# Patient Record
Sex: Female | Born: 1985
Health system: Southern US, Community
[De-identification: ages and names within clinical notes are randomized; demographics above are authoritative.]

## PROBLEM LIST (undated history)

## (undated) DIAGNOSIS — E119 Type 2 diabetes mellitus without complications: Secondary | ICD-10-CM

## (undated) DIAGNOSIS — M1990 Unspecified osteoarthritis, unspecified site: Secondary | ICD-10-CM

## (undated) DIAGNOSIS — D649 Anemia, unspecified: Secondary | ICD-10-CM

## (undated) DIAGNOSIS — E669 Obesity, unspecified: Secondary | ICD-10-CM

## (undated) DIAGNOSIS — I10 Essential (primary) hypertension: Secondary | ICD-10-CM

## (undated) HISTORY — DX: Essential (primary) hypertension: I10

## (undated) HISTORY — DX: Anemia, unspecified: D64.9

## (undated) HISTORY — DX: Unspecified osteoarthritis, unspecified site: M19.90

## (undated) HISTORY — DX: Obesity, unspecified: E66.9

---
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
## 2007-05-29 HISTORY — PX: DILATION AND CURETTAGE OF UTERUS: SHX78

## 2007-08-24 ENCOUNTER — Emergency Department: Payer: Self-pay | Admitting: Emergency Medicine

## 2007-09-01 ENCOUNTER — Emergency Department: Payer: Self-pay | Admitting: Emergency Medicine

## 2008-01-12 ENCOUNTER — Emergency Department: Payer: Self-pay | Admitting: Emergency Medicine

## 2008-03-02 ENCOUNTER — Emergency Department: Payer: Self-pay | Admitting: Emergency Medicine

## 2008-04-02 ENCOUNTER — Emergency Department: Payer: Self-pay | Admitting: Emergency Medicine

## 2008-04-08 ENCOUNTER — Ambulatory Visit: Payer: Self-pay | Admitting: Obstetrics and Gynecology

## 2008-04-09 ENCOUNTER — Ambulatory Visit: Payer: Self-pay | Admitting: Obstetrics and Gynecology

## 2008-10-06 ENCOUNTER — Emergency Department: Payer: Self-pay | Admitting: Emergency Medicine

## 2008-10-31 ENCOUNTER — Emergency Department: Payer: Self-pay | Admitting: Emergency Medicine

## 2008-12-18 ENCOUNTER — Emergency Department: Payer: Self-pay | Admitting: Emergency Medicine

## 2009-01-05 ENCOUNTER — Emergency Department: Payer: Self-pay | Admitting: Emergency Medicine

## 2009-02-19 ENCOUNTER — Observation Stay: Payer: Self-pay | Admitting: Unknown Physician Specialty

## 2009-03-14 ENCOUNTER — Ambulatory Visit: Payer: Self-pay | Admitting: Obstetrics and Gynecology

## 2009-03-28 ENCOUNTER — Ambulatory Visit: Payer: Self-pay | Admitting: Obstetrics and Gynecology

## 2009-03-28 ENCOUNTER — Encounter: Payer: Self-pay | Admitting: Maternal & Fetal Medicine

## 2009-04-04 ENCOUNTER — Encounter: Payer: Self-pay | Admitting: Obstetrics and Gynecology

## 2009-04-14 ENCOUNTER — Observation Stay: Payer: Self-pay

## 2009-04-14 ENCOUNTER — Encounter: Payer: Self-pay | Admitting: Obstetrics and Gynecology

## 2009-04-25 ENCOUNTER — Encounter: Payer: Self-pay | Admitting: Maternal & Fetal Medicine

## 2009-04-27 ENCOUNTER — Ambulatory Visit: Payer: Self-pay | Admitting: Obstetrics and Gynecology

## 2009-04-28 ENCOUNTER — Observation Stay: Payer: Self-pay

## 2009-05-04 ENCOUNTER — Observation Stay: Payer: Self-pay

## 2009-05-09 ENCOUNTER — Encounter: Payer: Self-pay | Admitting: Obstetrics and Gynecology

## 2009-05-14 ENCOUNTER — Observation Stay: Payer: Self-pay

## 2009-05-23 ENCOUNTER — Encounter: Payer: Self-pay | Admitting: Obstetrics and Gynecology

## 2009-05-27 ENCOUNTER — Observation Stay: Payer: Self-pay | Admitting: Obstetrics & Gynecology

## 2009-05-29 ENCOUNTER — Inpatient Hospital Stay: Payer: Self-pay | Admitting: Obstetrics & Gynecology

## 2009-05-31 DIAGNOSIS — O24419 Gestational diabetes mellitus in pregnancy, unspecified control: Secondary | ICD-10-CM

## 2009-06-24 ENCOUNTER — Emergency Department: Payer: Self-pay | Admitting: Emergency Medicine

## 2010-02-18 ENCOUNTER — Emergency Department: Payer: Self-pay | Admitting: Emergency Medicine

## 2010-09-22 ENCOUNTER — Emergency Department: Payer: Self-pay | Admitting: Emergency Medicine

## 2010-10-18 ENCOUNTER — Emergency Department: Payer: Self-pay | Admitting: Unknown Physician Specialty

## 2010-12-21 ENCOUNTER — Emergency Department: Payer: Self-pay | Admitting: Unknown Physician Specialty

## 2011-02-12 ENCOUNTER — Emergency Department: Payer: Self-pay | Admitting: Emergency Medicine

## 2011-04-13 ENCOUNTER — Emergency Department: Payer: Self-pay | Admitting: *Deleted

## 2011-04-15 ENCOUNTER — Other Ambulatory Visit: Payer: Self-pay | Admitting: *Deleted

## 2011-04-23 ENCOUNTER — Emergency Department: Payer: Self-pay | Admitting: Unknown Physician Specialty

## 2011-08-31 LAB — COMPREHENSIVE METABOLIC PANEL
Albumin: 2.9 g/dL — ABNORMAL LOW (ref 3.4–5.0)
Alkaline Phosphatase: 60 U/L (ref 50–136)
Bilirubin,Total: 0.4 mg/dL (ref 0.2–1.0)
Calcium, Total: 8.8 mg/dL (ref 8.5–10.1)
Chloride: 108 mmol/L — ABNORMAL HIGH (ref 98–107)
Creatinine: 0.59 mg/dL — ABNORMAL LOW (ref 0.60–1.30)
EGFR (Non-African Amer.): >60
SGOT(AST): 16 U/L (ref 15–37)
Sodium: 144 mmol/L (ref 136–145)

## 2011-08-31 LAB — CBC
MCH: 22.9 pg — ABNORMAL LOW (ref 26.0–34.0)
MCHC: 31.8 g/dL — ABNORMAL LOW (ref 32.0–36.0)
Platelet: 269 10*3/uL (ref 150–440)
RDW: 16.7 % — ABNORMAL HIGH (ref 11.5–14.5)

## 2011-08-31 LAB — PROTIME-INR
INR: 1
Prothrombin Time: 14 secs (ref 11.5–14.7)

## 2011-09-01 ENCOUNTER — Observation Stay: Payer: Self-pay

## 2011-12-04 ENCOUNTER — Ambulatory Visit: Payer: Self-pay

## 2011-12-04 LAB — CBC WITH DIFFERENTIAL/PLATELET
Basophil %: 0.8 %
Eosinophil %: 0.4 %
HGB: 7 g/dL — ABNORMAL LOW (ref 12.0–16.0)
Lymphocyte %: 19 %
MCV: 65 fL — ABNORMAL LOW (ref 80–100)
Monocyte #: 0.4 x10 3/mm (ref 0.2–0.9)
Monocyte %: 4.7 %
Neutrophil %: 75.1 %
Platelet: 152 10*3/uL (ref 150–440)
RBC: 3.6 10*6/uL — ABNORMAL LOW (ref 3.80–5.20)
WBC: 8.2 10*3/uL (ref 3.6–11.0)

## 2011-12-05 ENCOUNTER — Inpatient Hospital Stay: Payer: Self-pay | Admitting: Obstetrics and Gynecology

## 2011-12-06 LAB — HEMATOCRIT: HCT: 20.9 % — ABNORMAL LOW (ref 35.0–47.0)

## 2012-06-22 ENCOUNTER — Emergency Department (HOSPITAL_COMMUNITY)
Admission: EM | Admit: 2012-06-22 | Discharge: 2012-06-22 | Discharge status: Against Medical Advice | Payer: Self-pay | Attending: Emergency Medicine | Admitting: Emergency Medicine

## 2012-06-22 ENCOUNTER — Encounter (HOSPITAL_COMMUNITY): Payer: Self-pay | Admitting: Cardiology

## 2012-06-22 DIAGNOSIS — R222 Localized swelling, mass and lump, trunk: Secondary | ICD-10-CM | POA: Insufficient documentation

## 2012-06-22 NOTE — ED Notes (Signed)
Pt reports a lump on the left side of her chest that developed about 4 days ago. Reports it's on the inner side of her breast. States area is painful.

## 2012-10-09 ENCOUNTER — Ambulatory Visit: Payer: Self-pay | Admitting: Unknown Physician Specialty

## 2013-06-11 ENCOUNTER — Emergency Department: Payer: Self-pay | Admitting: Emergency Medicine

## 2013-06-14 LAB — BETA STREP CULTURE(ARMC)

## 2014-01-26 ENCOUNTER — Emergency Department: Payer: Self-pay | Admitting: Emergency Medicine

## 2014-02-06 ENCOUNTER — Emergency Department: Payer: Self-pay | Admitting: Emergency Medicine

## 2014-02-06 LAB — URIC ACID: Uric Acid: 4.9 mg/dL (ref 2.6–6.0)

## 2014-09-19 NOTE — Op Note (Signed)
PATIENT NAME:  Lisa Richard, Lisa Richard MR#:  [id number] DATE OF BIRTH:  1985/10/23  DATE OF PROCEDURE:  12/05/2011  PREOPERATIVE DIAGNOSES:  1. Intrauterine pregnancy, term.  2. Previous cesarean section, desires repeat.   POSTOPERATIVE DIAGNOSES:  1. Intrauterine pregnancy, term.  2. Previous cesarean section, desires repeat.   PROCEDURE PERFORMED: Low transverse cesarean section.   SURGEON: Deloris Pinghilip J. Luella Cookosenow, M.D.   FIRST ASSISTANT: Morrie SheldonAshley  <<MISSING TEXT>> (dictation stopped) ____________________________ Deloris PingPhilip J. Luella Cookosenow, MD pjr:slb D: 12/05/2011 08:59:17 ET T: 12/05/2011 09:48:24 ET JOB#: [id number]  cc: Deloris PingPhilip J. Luella Cookosenow, MD, <Dictator>

## 2014-09-19 NOTE — Op Note (Signed)
PATIENT NAME:  Lisa KnudsenCHAVIS, Myleka C MR#:  [id number] DATE OF BIRTH:  05-22-1986  DATE OF PROCEDURE:  12/05/2011  PREOPERATIVE DIAGNOSIS:  Intrauterine pregnancy, term.  Previous cesarean section, desires repeat.   POSTOPERATIVE DIAGNOSIS:  Intrauterine pregnancy, term.  Previous cesarean section, desires repeat.   OPERATION PERFORMED: Low transverse cesarean section.   SURGEON: Kate SablePhilip Rosenow, MD    FIRST ASSISTANT: Morrie Sheldonshley.   NEONATOLOGIST: Minerva AreolaEric  OPERATIVE FINDINGS: A 7 pounds, 4 ounces female infant delivered at 7:55 a.m., Apgar 9 and 9. It should be noted that there was much scarring from previous surgery both at the fascia and a dense band of adhesion from the anterior abdominal wall through the left lower uterine segment.   DESCRIPTION OF PROCEDURE: After adequate spinal anesthesia, the patient was prepped and draped in routine fashion. A skin incision in modified fashion was made through the previous scar and carried down the various layers encountering much scarring, and the peritoneal cavity was entered. The dense band of adhesions was divided. A low transverse incision was made. The above-described infant was delivered without difficulty. The placenta was removed manually. The cervix was "cracked."  The uterus was then closed in a continuous lock suture of chromic one. Several additional sutures were required for hemostasis. Ultimately, hemostasis was obtained. The rectus muscles were reapproximated in the midline. The fascia was reapproximated with a continuous suture of Maxon after placement of the On-Q pump. The skin was closed with skin staples. Estimated blood was 700 mL. The patient tolerated the procedure well and left the Operating Room in good condition. Sponge and needle counts were said to be correct at the end of the procedure. I have advised the patient in my humble opinion that having another cesarean section would be a poor idea, although ultimately the decision will need to  be hers.   ____________________________ Deloris PingPhilip J. Luella Cookosenow, MD pjr:cbb D: 12/05/2011 09:02:10 ET T: 12/05/2011 09:49:55 ET JOB#: [id number]  cc: Deloris PingPhilip J. Luella Cookosenow, MD, <Dictator> Towana BadgerPHILIP J ROSENOW MD ELECTRONICALLY SIGNED 12/08/2011 8:56

## 2015-05-06 ENCOUNTER — Emergency Department
Admission: EM | Admit: 2015-05-06 | Discharge: 2015-05-06 | Disposition: A | Discharge status: Home / Self Care | Payer: Self-pay | Attending: Emergency Medicine | Admitting: Emergency Medicine

## 2015-05-06 ENCOUNTER — Emergency Department: Payer: Self-pay

## 2015-05-06 ENCOUNTER — Encounter: Payer: Self-pay | Admitting: Urgent Care

## 2015-05-06 DIAGNOSIS — R059 Cough, unspecified: Secondary | ICD-10-CM

## 2015-05-06 DIAGNOSIS — F17200 Nicotine dependence, unspecified, uncomplicated: Secondary | ICD-10-CM | POA: Insufficient documentation

## 2015-05-06 DIAGNOSIS — R05 Cough: Secondary | ICD-10-CM

## 2015-05-06 DIAGNOSIS — J209 Acute bronchitis, unspecified: Secondary | ICD-10-CM | POA: Insufficient documentation

## 2015-05-06 MED ORDER — BENZONATATE 100 MG PO CAPS
200.0000 mg | ORAL_CAPSULE | Freq: Once | ORAL | Status: AC
Start: 2015-05-06 — End: 2015-05-06
  Administered 2015-05-06: 200 mg via ORAL
  Filled 2015-05-06: qty 2

## 2015-05-06 MED ORDER — AZITHROMYCIN 250 MG PO TABS: 500.0000 mg | ORAL_TABLET | Freq: Every day | ORAL | Status: AC

## 2015-05-06 MED ORDER — AZITHROMYCIN 250 MG PO TABS
500.0000 mg | ORAL_TABLET | Freq: Once | ORAL | Status: AC
  Administered 2015-05-06: 500 mg via ORAL
  Filled 2015-05-06: qty 2

## 2015-05-06 MED ORDER — BENZONATATE 200 MG PO CAPS: 200.0000 mg | ORAL_CAPSULE | Freq: Three times a day (TID) | ORAL | Status: DC | PRN

## 2015-05-06 MED ORDER — ALBUTEROL SULFATE HFA 108 (90 BASE) MCG/ACT IN AERS: 2.0000 | INHALATION_SPRAY | Freq: Four times a day (QID) | RESPIRATORY_TRACT | Status: DC | PRN

## 2015-05-06 NOTE — ED Notes (Addendum)
Pt presents with cough and congestion for 3-4 days. Pt is a smoker, about 5 cigarettes a day. Pt is laying in bed, equal rise and fall of chest. Pt has a runny nose. Pt was provided tissues.

## 2015-05-06 NOTE — ED Notes (Signed)
Patient presents with c/o cough and congestion x 3-4 days. Patient denies fever. NAD noted in triage; able to speak in complete sentences despite reports that she cannot breathe.

## 2015-05-06 NOTE — ED Provider Notes (Signed)
[facility] Emergency Department Provider Note  ____________________________________________  Time seen: 12:30 AM  I have reviewed the triage vital signs and the nursing notes.   HISTORY  Chief Complaint Cough and Nasal Congestion      HPI Lisa Richard is a 29 y.o. female presents with active cough 4 days. Patient denies any fever or does admit to rhinorrhea which is clear. Patient admits to cigarette smoking approximate 5 cigars per day.    Past Medical history None There are no active problems to display for this patient.   Past Surgical History  Procedure Laterality Date  . Cesarean section       x 2    No current outpatient prescriptions on file.  Allergies Review of patient's allergies indicates no known allergies.  No family history on file.  Social History Social History  Substance Use Topics  . Smoking status: Current Some Day Smoker  . Smokeless tobacco: None  . Alcohol Use: Yes    Review of Systems  Constitutional: Negative for fever. Eyes: Negative for visual changes. ENT: Negative for sore throat. Cardiovascular: Negative for chest pain. Respiratory: Positive for cough Gastrointestinal: Negative for abdominal pain, vomiting and diarrhea. Genitourinary: Negative for dysuria. Musculoskeletal: Negative for back pain. Skin: Negative for rash. Neurological: Negative for headaches, focal weakness or numbness.   10-point ROS otherwise negative.  ____________________________________________   PHYSICAL EXAM:  VITAL SIGNS: ED Triage Vitals  Enc Vitals Group     BP 05/06/15 0016 139/87 mmHg     Pulse Rate 05/06/15 0016 86     Resp 05/06/15 0016 18     Temp 05/06/15 0016 98.7 F (37.1 C)     Temp Source 05/06/15 0016 Oral     SpO2 05/06/15 0016 97 %     Weight 05/06/15 0016 215 lb (97.523 kg)     Height 05/06/15 0016  (1.549 m)     Head Cir --      Peak Flow --      Pain Score 05/06/15 0016 5   Pain Loc --      Pain Edu? --      Excl. in GC? --      Constitutional: Alert and oriented. Well appearing and in no distress. Eyes: Conjunctivae are normal. PERRL. Normal extraocular movements. ENT   Head: Normocephalic and atraumatic.   Nose: No congestion/rhinnorhea.   Mouth/Throat: Mucous membranes are moist.   Neck: No stridor. Hematological/Lymphatic/Immunilogical: No cervical lymphadenopathy. Cardiovascular: Normal rate, regular rhythm. Normal and symmetric distal pulses are present in all extremities. No murmurs, rubs, or gallops. Respiratory: Normal respiratory effort without tachypnea nor retractions. Breath sounds are clear and equal bilaterally. No wheezes/rales/rhonchi. Gastrointestinal: Soft and nontender. No distention. There is no CVA tenderness. Genitourinary: deferred Musculoskeletal: Nontender with normal range of motion in all extremities. No joint effusions.  No lower extremity tenderness nor edema. Neurologic:  Normal speech and language. No gross focal neurologic deficits are appreciated. Speech is normal.  Skin:  Skin is warm, dry and intact. No rash noted. Psychiatric: Mood and affect are normal. Speech and behavior are normal. Patient exhibits appropriate insight and judgment.    RADIOLOGY  DG Chest 2 View (Final result) Result time: 05/06/15 00:42:13   Procedure changed from [facility] Chest Portable 2 Views      Final result by Rad Results In Interface (05/06/15 00:42:13)   Narrative:   CLINICAL DATA: Acute onset of cough and congestion. Initial encounter.  EXAM: CHEST 2 VIEW  COMPARISON: None.  FINDINGS: The lungs are well-aerated. There is no evidence of focal opacification, pleural effusion or pneumothorax.  The heart is normal in size; the mediastinal contour is within normal limits. No acute osseous abnormalities are seen.  IMPRESSION: No acute cardiopulmonary process seen.   Electronically Signed By: Roanna RaiderJeffery Chang  M.D. On: 05/06/2015 00:42     INITIAL IMPRESSION / ASSESSMENT AND PLAN / ED COURSE  Pertinent labs & imaging results that were available during my care of the patient were reviewed by me and considered in my medical decision making (see chart for details).  Azithromycin and Tessalon Perles given.  ____________________________________________   FINAL CLINICAL IMPRESSION(S) / ED DIAGNOSES  Final diagnoses:  Acute bronchitis, unspecified organism      Darci Currentandolph N Daveion Robar, MD 05/06/15 [phone]

## 2015-05-06 NOTE — Discharge Instructions (Signed)

## 2015-05-09 ENCOUNTER — Ambulatory Visit: Payer: Self-pay

## 2015-05-17 ENCOUNTER — Ambulatory Visit: Payer: Self-pay

## 2015-06-27 ENCOUNTER — Emergency Department
Admission: EM | Admit: 2015-06-27 | Discharge: 2015-06-27 | Disposition: A | Discharge status: Home / Self Care | Payer: Medicaid Other | Attending: Emergency Medicine | Admitting: Emergency Medicine

## 2015-06-27 ENCOUNTER — Encounter: Payer: Self-pay | Admitting: Emergency Medicine

## 2015-06-27 DIAGNOSIS — R1030 Lower abdominal pain, unspecified: Secondary | ICD-10-CM

## 2015-06-27 DIAGNOSIS — O99011 Anemia complicating pregnancy, first trimester: Secondary | ICD-10-CM | POA: Insufficient documentation

## 2015-06-27 DIAGNOSIS — Z87891 Personal history of nicotine dependence: Secondary | ICD-10-CM | POA: Diagnosis not present

## 2015-06-27 DIAGNOSIS — Z3A01 Less than 8 weeks gestation of pregnancy: Secondary | ICD-10-CM | POA: Insufficient documentation

## 2015-06-27 DIAGNOSIS — O9989 Other specified diseases and conditions complicating pregnancy, childbirth and the puerperium: Secondary | ICD-10-CM | POA: Insufficient documentation

## 2015-06-27 DIAGNOSIS — Z3490 Encounter for supervision of normal pregnancy, unspecified, unspecified trimester: Secondary | ICD-10-CM

## 2015-06-27 DIAGNOSIS — D649 Anemia, unspecified: Secondary | ICD-10-CM

## 2015-06-27 LAB — COMPREHENSIVE METABOLIC PANEL
ALT: 13 U/L — AB (ref 14–54)
AST: 14 U/L — AB (ref 15–41)
Albumin: 3.8 g/dL (ref 3.5–5.0)
Alkaline Phosphatase: 49 U/L (ref 38–126)
Anion gap: 5 (ref 5–15)
BILIRUBIN TOTAL: 0.3 mg/dL (ref 0.3–1.2)
BUN: 10 mg/dL (ref 6–20)
CO2: 28 mmol/L (ref 22–32)
CREATININE: 1 mg/dL (ref 0.44–1.00)
Calcium: 9.1 mg/dL (ref 8.9–10.3)
Chloride: 106 mmol/L (ref 101–111)
GFR calc Af Amer: >60 mL/min (ref >60)
Glucose, Bld: 90 mg/dL (ref 65–99)
Potassium: 3.5 mmol/L (ref 3.5–5.1)
Sodium: 139 mmol/L (ref 135–145)
TOTAL PROTEIN: 7.2 g/dL (ref 6.5–8.1)

## 2015-06-27 LAB — CBC
HCT: 35.4 % (ref 35.0–47.0)
Hemoglobin: 11.3 g/dL — ABNORMAL LOW (ref 12.0–16.0)
MCH: 24.4 pg — ABNORMAL LOW (ref 26.0–34.0)
MCHC: 32 g/dL (ref 32.0–36.0)
MCV: 76.2 fL — ABNORMAL LOW (ref 80.0–100.0)
PLATELETS: 276 10*3/uL (ref 150–440)
RBC: 4.64 MIL/uL (ref 3.80–5.20)
RDW: 15.5 % — AB (ref 11.5–14.5)
WBC: 7.5 10*3/uL (ref 3.6–11.0)

## 2015-06-27 LAB — URINALYSIS COMPLETE WITH MICROSCOPIC (ARMC ONLY)
BILIRUBIN URINE: NEGATIVE
Bacteria, UA: NONE SEEN
GLUCOSE, UA: NEGATIVE mg/dL
HGB URINE DIPSTICK: NEGATIVE
Ketones, ur: NEGATIVE mg/dL
LEUKOCYTES UA: NEGATIVE
NITRITE: NEGATIVE
Protein, ur: NEGATIVE mg/dL
RBC / HPF: NONE SEEN RBC/hpf (ref 0–5)
SPECIFIC GRAVITY, URINE: 1.018 (ref 1.005–1.030)
pH: 5 (ref 5.0–8.0)

## 2015-06-27 LAB — LIPASE, BLOOD: Lipase: 23 U/L (ref 11–51)

## 2015-06-27 LAB — POCT PREGNANCY, URINE: Preg Test, Ur: POSITIVE — AB

## 2015-06-27 NOTE — ED Provider Notes (Addendum)
[facility] Emergency Department Provider Note  ____________________________________________  Time seen: Approximately 4:37 PM  I have reviewed the triage vital signs and the nursing notes.   HISTORY  Chief Complaint Abdominal Cramping    HPI Lisa Richard is a 30 y.o. female, NAD, presents to the emergency department with 3 days lower abdominal cramping. Denies nausea, vomiting, bowel or urinary changes, vaginal bleeding/discharge/pain. The changes in dietary habits. No trauma to the abdomen. No ill exposures. Notes last menstrual period late December 2016 but was only spotting with prior menses 3 weeks before that and was normal.   History reviewed. No pertinent past medical history.  There are no active problems to display for this patient.   Past Surgical History  Procedure Laterality Date  . Cesarean section       x 2    Current Outpatient Rx  Name  Route  Sig  Dispense  Refill  . albuterol (PROVENTIL HFA;VENTOLIN HFA) 108 (90 BASE) MCG/ACT inhaler   Inhalation   Inhale 2 puffs into the lungs every 6 (six) hours as needed for wheezing or shortness of breath.   1 Inhaler   2   . benzonatate (TESSALON) 200 MG capsule   Oral   Take 1 capsule (200 mg total) by mouth 3 (three) times daily as needed for cough.   20 capsule   0     Allergies Review of patient's allergies indicates no known allergies.  No family history on file.  Social History Social History  Substance Use Topics  . Smoking status: Former Games developer  . Smokeless tobacco: None  . Alcohol Use: No     Review of Systems  Constitutional: No fever/chills.  Cardiovascular: No chest pain. Respiratory: No cough. No shortness of breath. No wheezing.  Gastrointestinal: Lower abdominal pain.  No nausea, vomiting.  No diarrhea, constipation. Genitourinary: Negative for dysuria. No hematuria. No urinary hesitancy, urgency or increased frequency. Musculoskeletal: Negative for  back pain.  Skin: Negative for rash. Neurological: Negative for headaches, focal weakness or numbness. 10-point ROS otherwise negative.  ____________________________________________   PHYSICAL EXAM:  VITAL SIGNS: ED Triage Vitals  Enc Vitals Group     BP 06/27/15 1514 130/80 mmHg     Pulse Rate 06/27/15 1514 71     Resp 06/27/15 1514 14     Temp 06/27/15 1514 98.5 F (36.9 C)     Temp Source 06/27/15 1514 Oral     SpO2 06/27/15 1514 100 %     Weight 06/27/15 1514 210 lb (95.255 kg)     Height 06/27/15 1514  (1.549 m)     Head Cir --      Peak Flow --      Pain Score 06/27/15 1516 8     Pain Loc --      Pain Edu? --      Excl. in GC? --     Constitutional: Alert and oriented. Well appearing and in no acute distress. Eyes: Conjunctivae are normal. PERRL.  Head: Atraumatic. Neck: Supple with FROM Hematological/Lymphatic/Immunilogical: No cervical lymphadenopathy. Cardiovascular: Normal rate, regular rhythm. Normal S1 and S2.  Good peripheral circulation. Respiratory: Normal respiratory effort without tachypnea or retractions. Lungs CTAB. Gastrointestinal: Soft and nontender. No distention. No CVA tenderness. Musculoskeletal: No lower extremity tenderness nor edema.  No joint effusions. Neurologic:  Normal speech and language. No gross focal neurologic deficits are appreciated.  Skin:  Skin is warm, dry and intact. No rash noted. Psychiatric: Mood and affect  are normal. Speech and behavior are normal. Patient exhibits appropriate insight and judgement.   ____________________________________________   LABS (all labs ordered are listed, but only abnormal results are displayed)  Labs Reviewed  COMPREHENSIVE METABOLIC PANEL - Abnormal; Notable for the following:    AST 14 (*)    ALT 13 (*)    All other components within normal limits  CBC - Abnormal; Notable for the following:    Hemoglobin 11.3 (*)    MCV 76.2 (*)    MCH 24.4 (*)    RDW 15.5 (*)    All other  components within normal limits  URINALYSIS COMPLETEWITH MICROSCOPIC ([facility] ONLY) - Abnormal; Notable for the following:    Color, Urine YELLOW (*)    APPearance HAZY (*)    Squamous Epithelial / LPF 6-30 (*)    All other components within normal limits  POCT PREGNANCY, URINE - Abnormal; Notable for the following:    Preg Test, Ur POSITIVE (*)    All other components within normal limits  LIPASE, BLOOD  POC URINE PREG, ED   ____________________________________________  EKG  None ____________________________________________  RADIOLOGY  None ____________________________________________    PROCEDURES  Procedure(s) performed: None    Medications - No data to display   ____________________________________________   INITIAL IMPRESSION / ASSESSMENT AND PLAN / ED COURSE  Pertinent lab results that were available during my care of the patient were reviewed by me and considered in my medical decision making (see chart for details).  Patient's diagnosis is consistent with abdominal cramping due to pregnancy. Patient will be discharged home with prenatal instructions. Patient is to follow up with her care provider at [facility] to continue prenatal care. Patient is given ED precautions to return to the ED for any worsening or new symptoms.    ____________________________________________  FINAL CLINICAL IMPRESSION(S) / ED DIAGNOSES  Final diagnoses:  Lower abdominal pain  Pregnancy  Anemia, unspecified anemia type      NEW MEDICATIONS STARTED DURING THIS VISIT:  Discharge Medication List as of 06/27/2015  4:54 PM           Hope Pigeon, PA-C 06/27/15 1725  Myrna Blazer, MD 06/27/15 2110    ----------------------------------------- 9:31 PM on 06/27/2015 ----------------------------------------- I have left a message on the listed home phone number for the patient to return call to [facility] ED charge nurse. Charge nurse on shift notified to  ask the patient to return to ED for ultrasound.   Hope Pigeon, PA-C 06/27/15 2132  Discussed case with PA Hagler.  Concern for abdominal pain in a pregnant patient that did not have an ultrasound.  Myrna Blazer, MD 06/28/15 [phone]

## 2015-06-27 NOTE — Discharge Instructions (Signed)
Prenatal Care °WHAT IS PRENATAL CARE?  °Prenatal care is the process of caring for a pregnant woman before she gives birth. Prenatal care makes sure that she and her baby remain as healthy as possible throughout pregnancy. Prenatal care may be provided by a midwife, family practice health care provider, or a childbirth and pregnancy specialist (obstetrician). Prenatal care may include physical examinations, testing, treatments, and education on nutrition, lifestyle, and social support services. °WHY IS PRENATAL CARE SO IMPORTANT?  °Early and consistent prenatal care increases the chance that you and your baby will remain healthy throughout your pregnancy. This type of care also decreases a baby's risk of being born too early (prematurely), or being born smaller than expected (small for gestational age). Any underlying medical conditions you may have that could pose a risk during your pregnancy are discussed during prenatal care visits. You will also be monitored regularly for any new conditions that may arise during your pregnancy so they can be treated quickly and effectively. °WHAT HAPPENS DURING PRENATAL CARE VISITS? °Prenatal care visits may include the following: °Discussion °Tell your health care provider about any new signs or symptoms you have experienced since your last visit. These might include: °· Nausea or vomiting. °· Increased or decreased level of energy. °· Difficulty sleeping. °· Back or leg pain. °· Weight changes. °· Frequent urination. °· Shortness of breath with physical activity. °· Changes in your skin, such as the development of a rash or itchiness. °· Vaginal discharge or bleeding. °· Feelings of excitement or nervousness. °· Changes in your baby's movements. °You may want to write down any questions or topics you want to discuss with your health care provider and bring them with you to your appointment. °Examination °During your first prenatal care visit, you will likely have a complete  physical exam. Your health care provider will often examine your vagina, cervix, and the position of your uterus, as well as check your heart, lungs, and other body systems. As your pregnancy progresses, your health care provider will measure the size of your uterus and your baby's position inside your uterus. He or she may also examine you for early signs of labor. Your prenatal visits may also include checking your blood pressure and, after about 10-12 weeks of pregnancy, listening to your baby's heartbeat. °Testing °Regular testing often includes: °· Urinalysis. This checks your urine for glucose, protein, or signs of infection. °· Blood count. This checks the levels of white and red blood cells in your body. °· Tests for sexually transmitted infections (STIs). Testing for STIs at the beginning of pregnancy is routinely done and is required in many states. °· Antibody testing. You will be checked to see if you are immune to certain illnesses, such as rubella, that can affect a developing fetus. °· Glucose screen. Around 24-28 weeks of pregnancy, your blood glucose level will be checked for signs of gestational diabetes. Follow-up tests may be recommended. °· Group B strep. This is a bacteria that is commonly found inside a woman's vagina. This test will inform your health care provider if you need an antibiotic to reduce the amount of this bacteria in your body prior to labor and childbirth. °· Ultrasound. Many pregnant women undergo an ultrasound screening around 18-20 weeks of pregnancy to evaluate the health of the fetus and check for any developmental abnormalities. °· HIV (human immunodeficiency virus) testing. Early in your pregnancy, you will be screened for HIV. If you are at high risk for HIV, this test   may be repeated during your third trimester of pregnancy. °You may be offered other testing based on your age, personal or family medical history, or other factors.  °HOW OFTEN SHOULD I PLAN TO SEE MY  HEALTH CARE PROVIDER FOR PRENATAL CARE? °Your prenatal care check-up schedule depends on any medical conditions you have before, or develop during, your pregnancy. If you do not have any underlying medical conditions, you will likely be seen for checkups: °· Monthly, during the first 6 months of pregnancy. °· Twice a month during months 7 and 8 of pregnancy. °· Weekly starting in the 9th month of pregnancy and until delivery. °If you develop signs of early labor or other concerning signs or symptoms, you may need to see your health care provider more often. Ask your health care provider what prenatal care schedule is best for you. °WHAT CAN I DO TO KEEP MYSELF AND MY BABY AS HEALTHY AS POSSIBLE DURING MY PREGNANCY? °· Take a prenatal vitamin containing 400 micrograms (0.4 mg) of folic acid every day. Your health care provider may also ask you to take additional vitamins such as iodine, vitamin D, iron, copper, and zinc. °· Take 1500-2000 mg of calcium daily starting at your 20th week of pregnancy until you deliver your baby. °· Make sure you are up to date on your vaccinations. Unless directed otherwise by your health care provider: °¨ You should receive a tetanus, diphtheria, and pertussis (Tdap) vaccination between the 27th and 36th week of your pregnancy, regardless of when your last Tdap immunization occurred. This helps protect your baby from whooping cough (pertussis) after he or she is born. °¨ You should receive an annual inactivated influenza vaccine (IIV) to help protect you and your baby from influenza. This can be done at any point during your pregnancy. °· Eat a well-rounded diet that includes: °¨ Fresh fruits and vegetables. °¨ Lean proteins. °¨ Calcium-rich foods such as milk, yogurt, hard cheeses, and dark, leafy greens. °¨ Whole grain breads. °· Do not eat seafood high in mercury, including: °¨ Swordfish. °¨ Tilefish. °¨ Shark. °¨ King mackerel. °¨ More than 6 oz tuna per week. °· Do not eat: °¨ Raw  or undercooked meats or eggs. °¨ Unpasteurized foods, such as soft cheeses (brie, blue, or feta), juices, and milks. °¨ Lunch meats. °¨ Hot dogs that have not been heated until they are steaming. °· Drink enough water to keep your urine clear or pale yellow. For many women, this may be 10 or more 8 oz glasses of water each day. Keeping yourself hydrated helps deliver nutrients to your baby and may prevent the start of pre-term uterine contractions. °· Do not use any tobacco products including cigarettes, chewing tobacco, or electronic cigarettes. If you need help quitting, ask your health care provider. °· Do not drink beverages containing alcohol. No safe level of alcohol consumption during pregnancy has been determined. °· Do not use any illegal drugs. These can harm your developing baby or cause a miscarriage. °· Ask your health care provider or pharmacist before taking any prescription or over-the-counter medicines, herbs, or supplements. °· Limit your caffeine intake to no more than 200 mg per day. °· Exercise. Unless told otherwise by your health care provider, try to get 30 minutes of moderate exercise most days of the week. Do not  do high-impact activities, contact sports, or activities with a high risk of falling, such as horseback riding or downhill skiing. °· Get plenty of rest. °· Avoid anything that raises your   body temperature, such as hot tubs and saunas.  If you own a cat, do not empty its litter box. Bacteria contained in cat feces can cause an infection called toxoplasmosis. This can result in serious harm to the fetus.  Stay away from chemicals such as insecticides, lead, mercury, and cleaning or paint products that contain solvents.  Do not have any X-rays taken unless medically necessary.  Take a childbirth and breastfeeding preparation class. Ask your health care provider if you need a referral or recommendation.   This information is not intended to replace advice given to you by  your health care provider. Make sure you discuss any questions you have with your health care provider.   Document Released: 05/17/2003 Document Revised: 06/04/2014 Document Reviewed: 07/29/2013 Elsevier Interactive Patient Education 2016 [employer].  Pregnancy and Anemia Anemia is a condition in which the concentration of red blood cells or hemoglobin in the blood is below normal. Hemoglobin is a substance in red blood cells that carries oxygen to the tissues of the body. Anemia results in not enough oxygen reaching these tissues.  Anemia during pregnancy is common because the fetus uses more iron and folic acid as it is developing. Your body may not produce enough red blood cells because of this. Also, during pregnancy, the liquid part of the blood (plasma) increases by about 50%, and the red blood cells increase by only 25%. This lowers the concentration of the red blood cells and creates a natural anemia-like situation.  CAUSES  The most common cause of anemia during pregnancy is not having enough iron in the body to make red blood cells (iron deficiency anemia). Other causes may include:  Folic acid deficiency.  Vitamin B12 deficiency.  Certain prescription or over-the-counter medicines.  Certain medical conditions or infections that destroy red blood cells.  A low platelet count and bleeding caused by antibodies that go through the placenta to the fetus from the mother's blood. SIGNS AND SYMPTOMS  Mild anemia may not be noticeable. If it becomes severe, symptoms may include:  Tiredness.  Shortness of breath, especially with exercise.  Weakness.  Fainting.  Pale looking skin.  Headaches.  Feeling a fast or irregular heartbeat (palpitations). DIAGNOSIS  The type of anemia is usually diagnosed from your family and medical history and blood tests. TREATMENT  Treatment of anemia during pregnancy depends on the cause of the anemia. Treatment can include:  Supplements of  iron, vitamin B12, or folic acid.  A blood transfusion. This may be needed if blood loss is severe.  Hospitalization. This may be needed if there is significant continual blood loss.  Dietary changes. HOME CARE INSTRUCTIONS   Follow your dietitian's or health care provider's dietary recommendations.  Increase your vitamin C intake. This will help the stomach absorb more iron.  Eat a diet rich in iron. This would include foods such as:  Liver.  Beef.  Whole grain bread.  Eggs.  Dried fruit.  Take iron and vitamins as directed by your health care provider.  Eat green leafy vegetables. These are a good source of folic acid. SEEK MEDICAL CARE IF:   You have frequent or lasting headaches.  You are looking pale.  You are bruising easily. SEEK IMMEDIATE MEDICAL CARE IF:   You have extreme weakness, shortness of breath, or chest pain.  You become dizzy or have trouble concentrating.  You have heavy vaginal bleeding.  You develop a rash.  You have bloody or black, tarry stools.  You  faint.  You vomit up blood.  You vomit repeatedly.  You have abdominal pain.  You have a fever or persistent symptoms for more than 2-3 days.  You have a fever and your symptoms suddenly get worse.  You are dehydrated. MAKE SURE YOU:   Understand these instructions.  Will watch your condition.  Will get help right away if you are not doing well or get worse.   This information is not intended to replace advice given to you by your health care provider. Make sure you discuss any questions you have with your health care provider.   Document Released: 05/11/2000 Document Revised: 03/04/2013 Document Reviewed: 12/24/2012 Elsevier Interactive Patient Education [employer]

## 2015-06-27 NOTE — ED Notes (Signed)
abd cramping low--no vag bleeding--for 3 days.

## 2015-06-27 NOTE — ED Notes (Signed)
States she developed lower abd cramping for the past 3 days .Marland Kitchen No n/v/d  Or fever or vaginal bleeding. States she recently had implant removed in Nov. Then had 2 irregular periods in Dec./jan

## 2015-06-28 ENCOUNTER — Encounter: Payer: Self-pay | Admitting: Emergency Medicine

## 2015-06-28 ENCOUNTER — Emergency Department: Payer: Medicaid Other

## 2015-06-28 ENCOUNTER — Emergency Department
Admission: EM | Admit: 2015-06-28 | Discharge: 2015-06-28 | Disposition: A | Discharge status: Home / Self Care | Payer: Medicaid Other | Attending: Emergency Medicine | Admitting: Emergency Medicine

## 2015-06-28 DIAGNOSIS — Z87891 Personal history of nicotine dependence: Secondary | ICD-10-CM | POA: Diagnosis not present

## 2015-06-28 DIAGNOSIS — R109 Unspecified abdominal pain: Secondary | ICD-10-CM

## 2015-06-28 DIAGNOSIS — O9989 Other specified diseases and conditions complicating pregnancy, childbirth and the puerperium: Secondary | ICD-10-CM | POA: Insufficient documentation

## 2015-06-28 DIAGNOSIS — Z3A00 Weeks of gestation of pregnancy not specified: Secondary | ICD-10-CM | POA: Insufficient documentation

## 2015-06-28 DIAGNOSIS — Z3490 Encounter for supervision of normal pregnancy, unspecified, unspecified trimester: Secondary | ICD-10-CM

## 2015-06-28 DIAGNOSIS — R1030 Lower abdominal pain, unspecified: Secondary | ICD-10-CM | POA: Insufficient documentation

## 2015-06-28 DIAGNOSIS — O26899 Other specified pregnancy related conditions, unspecified trimester: Secondary | ICD-10-CM

## 2015-06-28 LAB — HCG, QUANTITATIVE, PREGNANCY: hCG, Beta Chain, Quant, S: 718 m[IU]/mL — ABNORMAL HIGH (ref <5)

## 2015-06-28 NOTE — ED Notes (Signed)
Pt in [place]

## 2015-06-28 NOTE — ED Notes (Signed)
Spoke to [place] in the lab regarding the lack of HCG results.  Lab staff reports that they did not see the order and they will run the test.

## 2015-06-28 NOTE — Discharge Instructions (Signed)
As we discussed it is important that you contact [facility] because you will need a repeat of the ultrasound and serum (blood) hcg (marker of pregnancy). Please seek medical attention for any high fevers, chest pain, shortness of breath, change in behavior, persistent vomiting, bloody stool or any other new or concerning symptoms.  First Trimester of Pregnancy The first trimester of pregnancy is from week 1 until the end of week 12 (months 1 through 3). During this time, your baby will begin to develop inside you. At 6-8 weeks, the eyes and face are formed, and the heartbeat can be seen on ultrasound. At the end of 12 weeks, all the baby's organs are formed. Prenatal care is all the medical care you receive before the birth of your baby. Make sure you get good prenatal care and follow all of your doctor's instructions. HOME CARE  Medicines  Take medicine only as told by your doctor. Some medicines are safe and some are not during pregnancy.  Take your prenatal vitamins as told by your doctor.  Take medicine that helps you poop (stool softener) as needed if your doctor says it is okay. Diet  Eat regular, healthy meals.  Your doctor will tell you the amount of weight gain that is right for you.  Avoid raw meat and uncooked cheese.  If you feel sick to your stomach (nauseous) or throw up (vomit):  Eat 4 or 5 small meals a day instead of 3 large meals.  Try eating a few soda crackers.  Drink liquids between meals instead of during meals.  If you have a hard time pooping (constipation):  Eat high-fiber foods like fresh vegetables, fruit, and whole grains.  Drink enough fluids to keep your pee (urine) clear or pale yellow. Activity and Exercise  Exercise only as told by your doctor. Stop exercising if you have cramps or pain in your lower belly (abdomen) or low back.  Try to avoid standing for long periods of time. Move your legs often if you must stand in one place for a long  time.  Avoid heavy lifting.  Wear low-heeled shoes. Sit and stand up straight.  You can have sex unless your doctor tells you not to. Relief of Pain or Discomfort  Wear a good support bra if your breasts are sore.  Take warm water baths (sitz baths) to soothe pain or discomfort caused by hemorrhoids. Use hemorrhoid cream if your doctor says it is okay.  Rest with your legs raised if you have leg cramps or low back pain.  Wear support hose if you have puffy, bulging veins (varicose veins) in your legs. Raise (elevate) your feet for 15 minutes, 3-4 times a day. Limit salt in your diet. Prenatal Care  Schedule your prenatal visits by the twelfth week of pregnancy.  Write down your questions. Take them to your prenatal visits.  Keep all your prenatal visits as told by your doctor. Safety  Wear your seat belt at all times when driving.  Make a list of emergency phone numbers. The list should include numbers for family, friends, the hospital, and police and fire departments. General Tips  Ask your doctor for a referral to a local prenatal class. Begin classes no later than at the start of month 6 of your pregnancy.  Ask for help if you need counseling or help with nutrition. Your doctor can give you advice or tell you where to go for help.  Do not use hot tubs, steam rooms, or  saunas.  Do not douche or use tampons or scented sanitary pads.  Do not cross your legs for long periods of time.  Avoid litter boxes and soil used by cats.  Avoid all smoking, herbs, and alcohol. Avoid drugs not approved by your doctor.  Do not use any tobacco products, including cigarettes, chewing tobacco, and electronic cigarettes. If you need help quitting, ask your doctor. You may get counseling or other support to help you quit.  Visit your dentist. At home, brush your teeth with a soft toothbrush. Be gentle when you floss. GET HELP IF:  You are dizzy.  You have mild cramps or pressure in  your lower belly.  You have a nagging pain in your belly area.  You continue to feel sick to your stomach, throw up, or have watery poop (diarrhea).  You have a bad smelling fluid coming from your vagina.  You have pain with peeing (urination).  You have increased puffiness (swelling) in your face, hands, legs, or ankles. GET HELP RIGHT AWAY IF:   You have a fever.  You are leaking fluid from your vagina.  You have spotting or bleeding from your vagina.  You have very bad belly cramping or pain.  You gain or lose weight rapidly.  You throw up blood. It may look like coffee grounds.  You are around people who have [place] measles, fifth disease, or chickenpox.  You have a very bad headache.  You have shortness of breath.  You have any kind of trauma, such as from a fall or a car accident.   This information is not intended to replace advice given to you by your health care provider. Make sure you discuss any questions you have with your health care provider.   Document Released: 10/31/2007 Document Revised: 06/04/2014 Document Reviewed: 03/24/2013 Elsevier Interactive Patient Education [employer]

## 2015-06-28 NOTE — ED Provider Notes (Signed)
[facility] Emergency Department Provider Note   ____________________________________________  Time seen: ~2025  I have reviewed the triage vital signs and the nursing notes.   HISTORY  Chief Complaint Abdominal Pain   History limited by: Not Limited   HPI Lisa Richard is a 30 y.o. female who presents to the emergency department today with continued abdominal cramping and known pregnancy. The patient has not had abdominal cramping for roughly the past 4 days. She states it is been constant. It is located in lower abdomen. It is cramping in nature. She was seen in the emergency department yesterday. During that visit she was instructed to follow-up with initially her primary care doctor however there was an attempt to contact the patient however return for an ultrasound. Patient went to the health department today where they sent her to the emergency department to get the ultrasound. Patient denies any vaginal bleeding.     History reviewed. No pertinent past medical history.  There are no active problems to display for this patient.   Past Surgical History  Procedure Laterality Date  . Cesarean section       x 2    Current Outpatient Rx  Name  Route  Sig  Dispense  Refill  . albuterol (PROVENTIL HFA;VENTOLIN HFA) 108 (90 BASE) MCG/ACT inhaler   Inhalation   Inhale 2 puffs into the lungs every 6 (six) hours as needed for wheezing or shortness of breath.   1 Inhaler   2   . benzonatate (TESSALON) 200 MG capsule   Oral   Take 1 capsule (200 mg total) by mouth 3 (three) times daily as needed for cough.   20 capsule   0     Allergies Review of patient's allergies indicates no known allergies.  History reviewed. No pertinent family history.  Social History Social History  Substance Use Topics  . Smoking status: Former Games developer  . Smokeless tobacco: None  . Alcohol Use: No    Review of Systems  Constitutional: Negative for  fever. Cardiovascular: Negative for chest pain. Respiratory: Negative for shortness of breath. Gastrointestinal: Positive for lower abdominal cramping Neurological: Negative for headaches, focal weakness or numbness.  10-point ROS otherwise negative.  ____________________________________________   PHYSICAL EXAM:  VITAL SIGNS: ED Triage Vitals  Enc Vitals Group     BP 06/28/15 1500 146/82 mmHg     Pulse Rate 06/28/15 1500 76     Resp 06/28/15 1500 20     Temp 06/28/15 1500 96 F (35.6 C)     Temp Source 06/28/15 1500 Tympanic     SpO2 06/28/15 1500 100 %     Weight 06/28/15 1500 207 lb (93.895 kg)     Height 06/28/15 1500  (1.549 m)     Head Cir --      Peak Flow --      Pain Score 06/28/15 1501 8   Constitutional: Alert and oriented. Well appearing and in no distress. Eyes: Conjunctivae are normal. PERRL. Normal extraocular movements. ENT   Head: Normocephalic and atraumatic.   Nose: No congestion/rhinnorhea.   Mouth/Throat: Mucous membranes are moist.   Neck: No stridor. Hematological/Lymphatic/Immunilogical: No cervical lymphadenopathy. Cardiovascular: Normal rate, regular rhythm.  No murmurs, rubs, or gallops. Respiratory: Normal respiratory effort without tachypnea nor retractions. Breath sounds are clear and equal bilaterally. No wheezes/rales/rhonchi. Gastrointestinal: Soft and nontender. No distention. There is no CVA tenderness. Genitourinary: Deferred Musculoskeletal: Normal range of motion in all extremities. No joint effusions.  No  lower extremity tenderness nor edema. Neurologic:  Normal speech and language. No gross focal neurologic deficits are appreciated.  Skin:  Skin is warm, dry and intact. No rash noted. Psychiatric: Mood and affect are normal. Speech and behavior are normal. Patient exhibits appropriate insight and judgment.  ____________________________________________    LABS (pertinent positives/negatives)  Labs Reviewed   HCG, QUANTITATIVE, PREGNANCY - Abnormal; Notable for the following:    hCG, Beta Chain, Quant, S 718 (*)    All other components within normal limits  BETA HCG, QUANT     ____________________________________________   EKG  None  ____________________________________________    RADIOLOGY  US  IMPRESSION: No evidence for an intrauterine gestational sac. However, there is thickening of the endometrium which could represent decidual reaction. Findings could be related to an early pregnancy but an early ectopic pregnancy cannot be excluded. Recommend follow-up quantitative B-HCG levels and follow-up US.   ____________________________________________   PROCEDURES  Procedure(s) performed: None  Critical Care performed: No  ____________________________________________   INITIAL IMPRESSION / ASSESSMENT AND PLAN / ED COURSE  Pertinent labs & imaging results that were available during my care of the patient were reviewed by me and considered in my medical decision making (see chart for details).  Patient presented to the emergency department for ultrasound given her pregnancy and abdominal cramping. Ultrasound could not directly visualize it pregnancy. I discussed this finding with patient. I discussed with her the importance that she follows with a OB/GYN doctor for repeat ultrasound and beta hCG  ____________________________________________   FINAL CLINICAL IMPRESSION(S) / ED DIAGNOSES  Final diagnoses:  Abdominal pain affecting pregnancy  Cramping affecting pregnancy, antepartum     Phineas Semen, MD 06/28/15 2235

## 2015-06-28 NOTE — ED Notes (Signed)
Pt to ed with c/o abd cramping and pain.  Pt states she was seen here yesterday for the same.  Reports she did not get an u/s.  Reports she went to health dept today for same and was told to come here for u.s.

## 2015-06-29 LAB — BETA HCG QUANT (REF LAB): BETA HCG, TUMOR MARKER: 590 m[IU]/mL

## 2015-07-12 ENCOUNTER — Emergency Department
Admission: EM | Admit: 2015-07-12 | Discharge: 2015-07-12 | Disposition: A | Discharge status: Home / Self Care | Payer: Medicaid Other | Attending: Emergency Medicine | Admitting: Emergency Medicine

## 2015-07-12 ENCOUNTER — Encounter: Payer: Self-pay | Admitting: *Deleted

## 2015-07-12 DIAGNOSIS — Z3A00 Weeks of gestation of pregnancy not specified: Secondary | ICD-10-CM | POA: Diagnosis not present

## 2015-07-12 DIAGNOSIS — O209 Hemorrhage in early pregnancy, unspecified: Secondary | ICD-10-CM | POA: Insufficient documentation

## 2015-07-12 LAB — URINALYSIS COMPLETE WITH MICROSCOPIC (ARMC ONLY)
BILIRUBIN URINE: NEGATIVE
Bacteria, UA: NONE SEEN
GLUCOSE, UA: NEGATIVE mg/dL
Hgb urine dipstick: NEGATIVE
KETONES UR: NEGATIVE mg/dL
LEUKOCYTES UA: NEGATIVE
Nitrite: NEGATIVE
Protein, ur: NEGATIVE mg/dL
RBC / HPF: NONE SEEN RBC/hpf (ref 0–5)
Specific Gravity, Urine: 1.011 (ref 1.005–1.030)
pH: 6 (ref 5.0–8.0)

## 2015-07-12 LAB — COMPREHENSIVE METABOLIC PANEL
ALK PHOS: 47 U/L (ref 38–126)
ALT: 16 U/L (ref 14–54)
AST: 18 U/L (ref 15–41)
Albumin: 3.6 g/dL (ref 3.5–5.0)
Anion gap: 6 (ref 5–15)
BILIRUBIN TOTAL: 0.4 mg/dL (ref 0.3–1.2)
BUN: 10 mg/dL (ref 6–20)
CALCIUM: 8.8 mg/dL — AB (ref 8.9–10.3)
CHLORIDE: 104 mmol/L (ref 101–111)
CO2: 25 mmol/L (ref 22–32)
CREATININE: 0.86 mg/dL (ref 0.44–1.00)
Glucose, Bld: 98 mg/dL (ref 65–99)
Potassium: 3.6 mmol/L (ref 3.5–5.1)
Sodium: 135 mmol/L (ref 135–145)
Total Protein: 7.6 g/dL (ref 6.5–8.1)

## 2015-07-12 LAB — CBC
HCT: 33 % — ABNORMAL LOW (ref 35.0–47.0)
Hemoglobin: 10.7 g/dL — ABNORMAL LOW (ref 12.0–16.0)
MCH: 24.6 pg — ABNORMAL LOW (ref 26.0–34.0)
MCHC: 32.4 g/dL (ref 32.0–36.0)
MCV: 75.9 fL — AB (ref 80.0–100.0)
PLATELETS: 327 10*3/uL (ref 150–440)
RBC: 4.34 MIL/uL (ref 3.80–5.20)
RDW: 15.3 % — AB (ref 11.5–14.5)
WBC: 10.4 10*3/uL (ref 3.6–11.0)

## 2015-07-12 LAB — HCG, QUANTITATIVE, PREGNANCY: hCG, Beta Chain, Quant, S: 25228 m[IU]/mL — ABNORMAL HIGH (ref <5)

## 2015-07-12 NOTE — ED Notes (Addendum)
Pt reports low abd pain and cramping.  Pt also has vag bleeding.  Pt is pregnant. Denies back pain.  No dysuria.

## 2015-09-18 ENCOUNTER — Emergency Department: Payer: Medicaid Other

## 2015-09-18 ENCOUNTER — Encounter: Payer: Self-pay | Admitting: *Deleted

## 2015-09-18 ENCOUNTER — Emergency Department
Admission: EM | Admit: 2015-09-18 | Discharge: 2015-09-18 | Disposition: A | Discharge status: Home / Self Care | Payer: Medicaid Other | Attending: Emergency Medicine | Admitting: Emergency Medicine

## 2015-09-18 DIAGNOSIS — Z87891 Personal history of nicotine dependence: Secondary | ICD-10-CM | POA: Diagnosis not present

## 2015-09-18 DIAGNOSIS — R109 Unspecified abdominal pain: Secondary | ICD-10-CM

## 2015-09-18 DIAGNOSIS — R1084 Generalized abdominal pain: Secondary | ICD-10-CM | POA: Diagnosis present

## 2015-09-18 DIAGNOSIS — O26899 Other specified pregnancy related conditions, unspecified trimester: Secondary | ICD-10-CM

## 2015-09-18 DIAGNOSIS — O26892 Other specified pregnancy related conditions, second trimester: Secondary | ICD-10-CM | POA: Insufficient documentation

## 2015-09-18 LAB — COMPREHENSIVE METABOLIC PANEL
ALK PHOS: 37 U/L — AB (ref 38–126)
ALT: 10 U/L — ABNORMAL LOW (ref 14–54)
ANION GAP: 9 (ref 5–15)
AST: 15 U/L (ref 15–41)
Albumin: 3.5 g/dL (ref 3.5–5.0)
BILIRUBIN TOTAL: 0.5 mg/dL (ref 0.3–1.2)
BUN: 12 mg/dL (ref 6–20)
CALCIUM: 9.2 mg/dL (ref 8.9–10.3)
CO2: 23 mmol/L (ref 22–32)
CREATININE: 0.58 mg/dL (ref 0.44–1.00)
Chloride: 105 mmol/L (ref 101–111)
GFR calc non Af Amer: >60 mL/min (ref >60)
Glucose, Bld: 69 mg/dL (ref 65–99)
Potassium: 3.5 mmol/L (ref 3.5–5.1)
Sodium: 137 mmol/L (ref 135–145)
TOTAL PROTEIN: 7.6 g/dL (ref 6.5–8.1)

## 2015-09-18 LAB — URINALYSIS COMPLETE WITH MICROSCOPIC (ARMC ONLY)
BACTERIA UA: NONE SEEN
Bilirubin Urine: NEGATIVE
GLUCOSE, UA: NEGATIVE mg/dL
HGB URINE DIPSTICK: NEGATIVE
Ketones, ur: NEGATIVE mg/dL
LEUKOCYTES UA: NEGATIVE
Nitrite: NEGATIVE
PH: 6 (ref 5.0–8.0)
PROTEIN: 30 mg/dL — AB
SPECIFIC GRAVITY, URINE: 1.029 (ref 1.005–1.030)

## 2015-09-18 LAB — CBC
HEMATOCRIT: 30.3 % — AB (ref 35.0–47.0)
HEMOGLOBIN: 9.8 g/dL — AB (ref 12.0–16.0)
MCH: 25.3 pg — ABNORMAL LOW (ref 26.0–34.0)
MCHC: 32.3 g/dL (ref 32.0–36.0)
MCV: 78.2 fL — AB (ref 80.0–100.0)
Platelets: 294 10*3/uL (ref 150–440)
RBC: 3.87 MIL/uL (ref 3.80–5.20)
RDW: 14.9 % — AB (ref 11.5–14.5)
WBC: 11.3 10*3/uL — ABNORMAL HIGH (ref 3.6–11.0)

## 2015-09-18 LAB — LIPASE, BLOOD: Lipase: 16 U/L (ref 11–51)

## 2015-09-18 LAB — TROPONIN I

## 2015-09-18 MED ORDER — ONDANSETRON 4 MG PO TBDP
ORAL_TABLET | ORAL | Status: AC
  Filled 2015-09-18: qty 1

## 2015-09-18 MED ORDER — ONDANSETRON 4 MG PO TBDP
4.0000 mg | ORAL_TABLET | Freq: Once | ORAL | Status: AC
  Administered 2015-09-18: 4 mg via ORAL

## 2015-09-18 MED ORDER — ACETAMINOPHEN 500 MG PO TABS
1000.0000 mg | ORAL_TABLET | Freq: Once | ORAL | Status: AC
  Administered 2015-09-18: 1000 mg via ORAL
  Filled 2015-09-18: qty 2

## 2015-09-18 NOTE — Discharge Instructions (Signed)
Abdominal Pain, Adult Many things can cause abdominal pain. Usually, abdominal pain is not caused by a disease and will improve without treatment. It can often be observed and treated at home. Your health care provider will do a physical exam and possibly order blood tests and X-rays to help determine the seriousness of your pain. However, in many cases, more time must pass before a clear cause of the pain can be found. Before that point, your health care provider may not know if you need more testing or further treatment. HOME CARE INSTRUCTIONS Monitor your abdominal pain for any changes. The following actions may help to alleviate any discomfort you are experiencing:  Only take over-the-counter or prescription medicines as directed by your health care provider.  Do not take laxatives unless directed to do so by your health care provider.  Try a clear liquid diet (broth, tea, or water) as directed by your health care provider. Slowly move to a bland diet as tolerated. SEEK MEDICAL CARE IF:  You have unexplained abdominal pain.  You have abdominal pain associated with nausea or diarrhea.  You have pain when you urinate or have a bowel movement.  You experience abdominal pain that wakes you in the night.  You have abdominal pain that is worsened or improved by eating food.  You have abdominal pain that is worsened with eating fatty foods.  You have a fever. SEEK IMMEDIATE MEDICAL CARE IF:  Your pain does not go away within 2 hours.  You keep throwing up (vomiting).  Your pain is felt only in portions of the abdomen, such as the right side or the left lower portion of the abdomen.  You pass bloody or black tarry stools. MAKE SURE YOU:  Understand these instructions.  Will watch your condition.  Will get help right away if you are not doing well or get worse.   This information is not intended to replace advice given to you by your health care provider. Make sure you discuss  any questions you have with your health care provider.   Document Released: 02/21/2005 Document Revised: 02/02/2015 Document Reviewed: 01/21/2013 Elsevier Interactive Patient Education [employer]2016 Elsevier Inc.  Please return immediately if condition worsens. Please contact her primary physician or the physician you were given for referral. If you have any specialist physicians involved in her treatment and plan please also contact them. Thank you for using [facility]. Please take over-the-counter Tylenol for pain. Return emergency department especially for fever, vaginal bleeding, abnormal vaginal discharge, focal pain, or any new concerns

## 2015-09-18 NOTE — ED Notes (Signed)
Patient transported to Ultrasound 

## 2015-09-18 NOTE — ED Notes (Signed)
Pt is G4P2A1, LMP: 05/24/15, due 02/28/16. Pt c/o generalized abdominal pain that started x 4 days ago and has remained consistent over that time. Pt denies vaginal bleeding, urinary sxs, n/v/d, and fever. Pt ambulatory to triage and in no acute distress at this time.

## 2015-09-18 NOTE — ED Provider Notes (Addendum)
Time Seen: Approximately 2050  I have reviewed the triage notes  Chief Complaint: Abdominal Pain   History of Present Illness: Lisa Richard is a 30 y.o. female *who presents as gravida 4 para 2 with one previous spontaneous miscarriage. Patient [redacted] weeks pregnant. She has had outpatient evaluation by [facility] side OB/GYN. She states her ultrasound showed her pregnancy and a normal location. She denies any vaginal discharge or bleeding. She states she will hasn't felt any consistent fetal movements up to this point. She is describing some diffuse abdominal pain that started 4 days ago. She's denies any localization of the pain. She denies any exacerbating or relieving factors. She denies any loose stool she's had some occasional nausea with no vomiting. She denies any melena or hematochezia. She denies much in way of urinary complaints such as dysuria or hematuria.   History reviewed. No pertinent past medical history.  There are no active problems to display for this patient.   Past Surgical History  Procedure Laterality Date  . Cesarean section       x 2    Past Surgical History  Procedure Laterality Date  . Cesarean section       x 2    Current Outpatient Rx  Name  Route  Sig  Dispense  Refill  . albuterol (PROVENTIL HFA;VENTOLIN HFA) 108 (90 BASE) MCG/ACT inhaler   Inhalation   Inhale 2 puffs into the lungs every 6 (six) hours as needed for wheezing or shortness of breath.   1 Inhaler   2   . benzonatate (TESSALON) 200 MG capsule   Oral   Take 1 capsule (200 mg total) by mouth 3 (three) times daily as needed for cough.   20 capsule   0     Allergies:  Review of patient's allergies indicates no known allergies.  Family History: History reviewed. No pertinent family history.  Social History: Social History  Substance Use Topics  . Smoking status: Former Games developer  . Smokeless tobacco: None  . Alcohol Use: No     Review of Systems:   10 point review of  systems was performed and was otherwise negative:  Constitutional: No fever Eyes: No visual disturbances ENT: No sore throat, ear pain Cardiac: No chest pain Respiratory: No shortness of breath, wheezing, or stridor Abdomen: Diffuse crampy abdominal pain and nonlocalized. No radiation to the back or flank area. Endocrine: No weight loss, No night sweats Extremities: No peripheral edema, cyanosis Skin: No rashes, easy bruising Neurologic: No focal weakness, trouble with speech or swollowing Urologic: No dysuria, Hematuria, or urinary frequency   Physical Exam:  ED Triage Vitals  Enc Vitals Group     BP 09/18/15 2031 121/61 mmHg     Pulse Rate 09/18/15 2031 80     Resp --      Temp 09/18/15 2031 98.8 F (37.1 C)     Temp Source 09/18/15 2031 Oral     SpO2 09/18/15 2031 100 %     Weight 09/18/15 2031 208 lb (94.348 kg)     Height 09/18/15 2031  (1.549 m)     Head Cir --      Peak Flow --      Pain Score 09/18/15 2037 8     Pain Loc --      Pain Edu? --      Excl. in GC? --     General: Awake , Alert , and Oriented times 3; GCS 15 Head: Normal cephalic ,  atraumatic Eyes: Pupils equal , round, reactive to light Nose/Throat: No nasal drainage, patent upper airway without erythema or exudate.  Neck: Supple, Full range of motion, No anterior adenopathy or palpable thyroid masses Lungs: Clear to ascultation without wheezes , rhonchi, or rales Heart: Regular rate, regular rhythm without murmurs , gallops , or rubs Abdomen: Soft, non tender without rebound, guarding , or rigidity; bowel sounds positive and symmetric in all 4 quadrants. No organomegaly .        Extremities: 2 plus symmetric pulses. No edema, clubbing or cyanosis Neurologic: normal ambulation, Motor symmetric without deficits, sensory intact Skin: warm, dry, no rashes   Labs:   All laboratory work was reviewed including any pertinent negatives or positives listed below:  Labs Reviewed  CBC - Abnormal;  Notable for the following:    WBC 11.3 (*)    Hemoglobin 9.8 (*)    HCT 30.3 (*)    MCV 78.2 (*)    MCH 25.3 (*)    RDW 14.9 (*)    All other components within normal limits  URINALYSIS COMPLETEWITH MICROSCOPIC ([facility] ONLY) - Abnormal; Notable for the following:    Color, Urine YELLOW (*)    APPearance CLEAR (*)    Protein, ur 30 (*)    Squamous Epithelial / LPF 0-5 (*)    All other components within normal limits  LIPASE, BLOOD  COMPREHENSIVE METABOLIC PANEL  TROPONIN I   review laboratory work shows some anemia, likely iron deficiency  EKG:  ED ECG REPORT I, Jennye Moccasin, the attending physician, personally viewed and interpreted this ECG.  Date: 09/18/2015 EKG Time: 2041 Rate: *79 Rhythm: normal sinus rhythm QRS Axis: normal Intervals: normal ST/T Wave abnormalities: normal Conduction Disturbances: none Narrative Interpretation: unremarkable No acute ischemic changes Poor R-wave progression in the anterior leads  Radiology:    : LIMITED OBSTETRIC ULTRASOUND  FINDINGS: Number of Fetuses: 1  Heart Rate: 143 bpm  Presentation: Breech  Placental Location: Anterior. No evidence of subplacental collection  Amniotic Fluid (Subjective): Within normal limits.  BPD: 3.8cm 17w 5d  MATERNAL FINDINGS:  Cervix: Appears closed.  Uterus/Adnexae: No abnormality visualized. The right ovary is not visible. Left ovary is normal including corpus luteum.  IMPRESSION: 1. Single living intrauterine pregnancy measuring 17 weeks 5 days. No pathologic finding. 2. This exam is performed on an emergent basis and does not comprehensively evaluate fetal size, dating, or anatomy; follow-up complete OB US should be considered if further fetal assessment is warranted.    I personally reviewed the radiologic studies   ED Course:  Patient's stay here was uneventful and she was given Tylenol here with some symptomatic improvement. She does not present with focal  abdominal pain and given her clinical presentation and objective findings are not sure of the exact nature of her pain at this time. It does not appear to be surgical such as acute appendicitis or acute cholecystitis based on location and exam. Generalized pain may be related to her developing second trimester pregnancy. She was advised to return here if she develops a fever, focal pain, etc. She was also advised to contact her OB/GYN for repeat evaluation this week if possible. Patient was advised take Tylenol round-the-clock as needed for pain. She was given a copy of her ultrasound at her request to take with her. We gave her a copy of the radiology reading. Patient was also advised take her prenatal vitamins. She admits that she's been somewhat forgetful and taking her vitamins. I  felt this would help her with her relative anemia. Assessment:  Acute unspecified abdominal pain  Second trimester pregnancy Final Clinical Impression:   Final diagnoses:  Abdominal pain in pregnancy     Plan: * Outpatient management Patient was advised to return immediately if condition worsens. Patient was advised to follow up with their primary care physician or other specialized physicians involved in their outpatient care. The patient and/or family member/power of attorney had laboratory results reviewed at the bedside. All questions and concerns were addressed and appropriate discharge instructions were distributed by the nursing staff.             Jennye MoccasinBrian S Arran Fessel, MD 09/18/15 [phone]  Jennye MoccasinBrian S Danilo Cappiello, MD 09/18/15 [phone]

## 2016-02-20 ENCOUNTER — Observation Stay: Payer: Medicaid Other | Admitting: Anesthesiology

## 2016-02-20 ENCOUNTER — Inpatient Hospital Stay
Admission: EM | Admit: 2016-02-20 | Discharge: 2016-02-23 | DRG: 765 | Disposition: A | Discharge status: Home / Self Care | Payer: Medicaid Other | Attending: Obstetrics and Gynecology | Admitting: Obstetrics and Gynecology

## 2016-02-20 ENCOUNTER — Encounter: Payer: Self-pay | Admitting: *Deleted

## 2016-02-20 ENCOUNTER — Encounter
Admission: EM | Disposition: A | Discharge status: Home / Self Care | Payer: Self-pay | Source: Home / Self Care | Attending: Obstetrics and Gynecology

## 2016-02-20 DIAGNOSIS — O99214 Obesity complicating childbirth: Secondary | ICD-10-CM | POA: Diagnosis present

## 2016-02-20 DIAGNOSIS — D509 Iron deficiency anemia, unspecified: Secondary | ICD-10-CM | POA: Diagnosis present

## 2016-02-20 DIAGNOSIS — Z87891 Personal history of nicotine dependence: Secondary | ICD-10-CM

## 2016-02-20 DIAGNOSIS — O99824 Streptococcus B carrier state complicating childbirth: Secondary | ICD-10-CM | POA: Diagnosis present

## 2016-02-20 DIAGNOSIS — O34211 Maternal care for low transverse scar from previous cesarean delivery: Secondary | ICD-10-CM | POA: Diagnosis present

## 2016-02-20 DIAGNOSIS — Z23 Encounter for immunization: Secondary | ICD-10-CM

## 2016-02-20 DIAGNOSIS — Z3A38 38 weeks gestation of pregnancy: Secondary | ICD-10-CM

## 2016-02-20 DIAGNOSIS — O9902 Anemia complicating childbirth: Secondary | ICD-10-CM | POA: Diagnosis present

## 2016-02-20 DIAGNOSIS — O99284 Endocrine, nutritional and metabolic diseases complicating childbirth: Secondary | ICD-10-CM | POA: Diagnosis present

## 2016-02-20 DIAGNOSIS — Z6839 Body mass index (BMI) 39.0-39.9, adult: Secondary | ICD-10-CM

## 2016-02-20 DIAGNOSIS — D62 Acute posthemorrhagic anemia: Secondary | ICD-10-CM | POA: Diagnosis present

## 2016-02-20 DIAGNOSIS — O134 Gestational [pregnancy-induced] hypertension without significant proteinuria, complicating childbirth: Principal | ICD-10-CM | POA: Diagnosis present

## 2016-02-20 DIAGNOSIS — O163 Unspecified maternal hypertension, third trimester: Secondary | ICD-10-CM

## 2016-02-20 LAB — CBC
HEMATOCRIT: 25.2 % — AB (ref 35.0–47.0)
Hemoglobin: 7.7 g/dL — ABNORMAL LOW (ref 12.0–16.0)
MCH: 19.6 pg — ABNORMAL LOW (ref 26.0–34.0)
MCHC: 30.5 g/dL — AB (ref 32.0–36.0)
MCV: 64.1 fL — AB (ref 80.0–100.0)
Platelets: 167 10*3/uL (ref 150–440)
RBC: 3.93 MIL/uL (ref 3.80–5.20)
RDW: 19.8 % — AB (ref 11.5–14.5)
WBC: 7.5 10*3/uL (ref 3.6–11.0)

## 2016-02-20 LAB — ABO/RH: ABO/RH(D): O POS

## 2016-02-20 LAB — COMPREHENSIVE METABOLIC PANEL
ALBUMIN: 2.5 g/dL — AB (ref 3.5–5.0)
ALT: 11 U/L — ABNORMAL LOW (ref 14–54)
AST: 24 U/L (ref 15–41)
Alkaline Phosphatase: 137 U/L — ABNORMAL HIGH (ref 38–126)
Anion gap: 7 (ref 5–15)
BUN: 6 mg/dL (ref 6–20)
CHLORIDE: 106 mmol/L (ref 101–111)
CO2: 22 mmol/L (ref 22–32)
Calcium: 8.5 mg/dL — ABNORMAL LOW (ref 8.9–10.3)
Creatinine, Ser: 0.48 mg/dL (ref 0.44–1.00)
GFR calc Af Amer: >60 mL/min (ref >60)
GFR calc non Af Amer: >60 mL/min (ref >60)
GLUCOSE: 72 mg/dL (ref 65–99)
POTASSIUM: 3.1 mmol/L — AB (ref 3.5–5.1)
SODIUM: 135 mmol/L (ref 135–145)
Total Bilirubin: 0.7 mg/dL (ref 0.3–1.2)
Total Protein: 6.6 g/dL (ref 6.5–8.1)

## 2016-02-20 LAB — PREPARE RBC (CROSSMATCH)

## 2016-02-20 LAB — PROTEIN / CREATININE RATIO, URINE
Creatinine, Urine: 69 mg/dL
PROTEIN CREATININE RATIO: 0.1 mg/mg{creat} (ref 0.00–0.15)
TOTAL PROTEIN, URINE: 7 mg/dL

## 2016-02-20 SURGERY — Surgical Case
Anesthesia: Spinal

## 2016-02-20 MED ORDER — OXYTOCIN 40 UNITS IN LACTATED RINGERS INFUSION - SIMPLE MED
INTRAVENOUS | Status: DC | PRN
  Administered 2016-02-20: 1 mL via INTRAVENOUS
  Administered 2016-02-20: 199 mL via INTRAVENOUS

## 2016-02-20 MED ORDER — LACTATED RINGERS IV SOLN
500.0000 mL | INTRAVENOUS | Status: DC | PRN
  Administered 2016-02-20: 125 mL via INTRAVENOUS
  Administered 2016-02-20: 22:00:00 via INTRAVENOUS
  Administered 2016-02-20: 1000 mL via INTRAVENOUS
  Administered 2016-02-21: 300 mL via INTRAVENOUS

## 2016-02-20 MED ORDER — SODIUM CHLORIDE 0.9 % IV SOLN: Freq: Once | INTRAVENOUS | Status: DC

## 2016-02-20 MED ORDER — BUPIVACAINE IN DEXTROSE 0.75-8.25 % IT SOLN
INTRATHECAL | Status: DC | PRN
  Administered 2016-02-20: 1.5 mL via INTRATHECAL

## 2016-02-20 MED ORDER — BUPIVACAINE HCL (PF) 0.5 % IJ SOLN
5.0000 mL | Freq: Once | INTRAMUSCULAR | Status: DC
  Filled 2016-02-20: qty 30

## 2016-02-20 MED ORDER — DEXTROSE 5 % IV SOLN
3.0000 g | INTRAVENOUS | Status: AC
  Administered 2016-02-20: 3 g via INTRAVENOUS
  Filled 2016-02-20: qty 3000

## 2016-02-20 MED ORDER — MORPHINE SULFATE (PF) 0.5 MG/ML IJ SOLN
INTRAMUSCULAR | Status: DC | PRN
  Administered 2016-02-20: .1 mg via INTRATHECAL

## 2016-02-20 MED ORDER — BUPIVACAINE HCL (PF) 0.5 % IJ SOLN: 5.0000 mL | Freq: Once | INTRAMUSCULAR | Status: DC

## 2016-02-20 MED ORDER — FENTANYL CITRATE (PF) 100 MCG/2ML IJ SOLN
25.0000 ug | INTRAMUSCULAR | Status: DC | PRN
  Administered 2016-02-21 (×2): 50 ug via INTRAVENOUS
  Filled 2016-02-20: qty 2

## 2016-02-20 MED ORDER — ONDANSETRON HCL 4 MG/2ML IJ SOLN: 4.0000 mg | Freq: Once | INTRAMUSCULAR | Status: DC | PRN

## 2016-02-20 MED ORDER — PHENYLEPHRINE HCL 10 MG/ML IJ SOLN
INTRAMUSCULAR | Status: DC | PRN
  Administered 2016-02-20 (×2): 100 ug via INTRAVENOUS

## 2016-02-20 MED ORDER — ONDANSETRON HCL 4 MG/2ML IJ SOLN
INTRAMUSCULAR | Status: DC | PRN
  Administered 2016-02-20: 4 mg via INTRAVENOUS

## 2016-02-20 MED ORDER — SOD CITRATE-CITRIC ACID 500-334 MG/5ML PO SOLN
30.0000 mL | ORAL | Status: AC
  Administered 2016-02-20: 30 mL via ORAL
  Filled 2016-02-20: qty 15

## 2016-02-20 MED ORDER — BUPIVACAINE HCL 0.5 % IJ SOLN
INTRAMUSCULAR | Status: DC | PRN
  Administered 2016-02-20: 10 mL

## 2016-02-20 MED ORDER — FENTANYL CITRATE (PF) 100 MCG/2ML IJ SOLN
INTRAMUSCULAR | Status: DC | PRN
  Administered 2016-02-20: 15 ug via INTRATHECAL

## 2016-02-20 SURGICAL SUPPLY — 31 items
CANISTER SUCT 3000ML (MISCELLANEOUS) ×3 IMPLANT
CATH KIT ON-Q SILVERSOAK 5IN (CATHETERS) ×6 IMPLANT
CLOSURE WOUND 1/2 X4 (GAUZE/BANDAGES/DRESSINGS)
DRSG OPSITE POSTOP 4X10 (GAUZE/BANDAGES/DRESSINGS) ×3 IMPLANT
DRSG TELFA 3X8 NADH (GAUZE/BANDAGES/DRESSINGS) IMPLANT
ELECT CAUTERY BLADE 6.4 (BLADE) ×3 IMPLANT
ELECT REM PT RETURN 9FT ADLT (ELECTROSURGICAL) ×3
ELECTRODE REM PT RTRN 9FT ADLT (ELECTROSURGICAL) ×1 IMPLANT
GAUZE SPONGE 4X4 12PLY STRL (GAUZE/BANDAGES/DRESSINGS) IMPLANT
GLOVE BIO SURGEON STRL SZ7 (GLOVE) ×12 IMPLANT
GLOVE INDICATOR 7.5 STRL GRN (GLOVE) ×12 IMPLANT
GOWN STRL REUS W/ TWL LRG LVL3 (GOWN DISPOSABLE) ×4 IMPLANT
GOWN STRL REUS W/TWL LRG LVL3 (GOWN DISPOSABLE) ×8
LIQUID BAND (GAUZE/BANDAGES/DRESSINGS) ×3 IMPLANT
NS IRRIG 1000ML POUR BTL (IV SOLUTION) ×3 IMPLANT
PACK C SECTION AR (MISCELLANEOUS) ×3 IMPLANT
PAD OB MATERNITY 4.3X12.25 (PERSONAL CARE ITEMS) ×3 IMPLANT
PAD PREP 24X41 OB/GYN DISP (PERSONAL CARE ITEMS) ×3 IMPLANT
RETRACTOR TRAXI PANNICULUS (MISCELLANEOUS) ×1 IMPLANT
SPONGE LAP 18X18 5 PK (GAUZE/BANDAGES/DRESSINGS) IMPLANT
STRIP CLOSURE SKIN 1/2X4 (GAUZE/BANDAGES/DRESSINGS) IMPLANT
SUT CHROMIC GUT BROWN 0 54 (SUTURE) ×1 IMPLANT
SUT CHROMIC GUT BROWN 0 54IN (SUTURE) ×3
SUT MNCRL 4-0 (SUTURE) ×2
SUT MNCRL 4-0 27XMFL (SUTURE) ×1
SUT PDS AB 1 TP1 96 (SUTURE) ×3 IMPLANT
SUT PLAIN 2 0 XLH (SUTURE) ×3 IMPLANT
SUT VIC AB 0 CT1 36 (SUTURE) ×12 IMPLANT
SUTURE MNCRL 4-0 27XMF (SUTURE) ×1 IMPLANT
SWABSTK COMLB BENZOIN TINCTURE (MISCELLANEOUS) IMPLANT
TRAXI PANNICULUS RETRACTOR (MISCELLANEOUS) ×2

## 2016-02-20 NOTE — OB Triage Note (Addendum)
Pt. Sent over from the office for further evaluation of BP. RN to the bedside for assessment, external monitors applied, FHR 130s, toco applied - abd. Soft.  Denies vaginal bleeding;  clear color, moderate amount of discharge.  Positive for fetal movement. Pt. Denies headache, visual changes or epigastric pain.

## 2016-02-20 NOTE — Discharge Summary (Signed)
OB Discharge Summary     Patient Name: Lisa Richard DOB: 1986/05/01 MRN: [id number]  Date of admission: 02/20/2016 Delivering MD: Conard Novak, MD  Date of Delivery: 02/20/2016  Date of discharge: 02/23/2016  Admitting diagnosis:  1) gestational hypertension 2) intrauterine pregnancy at gastroenteritis  3) history of cesarean section  Intrauterine pregnancy: [redacted]w[redacted]d     Secondary diagnosis: Gestational Hypertension and Anemia     Discharge diagnosis: Term Pregnancy Delivered, Gestational Hypertension and Anemia                                                                                                Post partum procedures:blood transfusion x 2 units pRBCs  Augmentation: n/a  Complications: None  Hospital course:  The patient was admitted with elevated blood pressure consistent with a diagnosis of gestational hypertension.  She was taken to the operating room where she had a repeat cesarean delivery that occurred without delivery.  She was transfused one unit pRBCs during the surgery due to her very low start blood counts (hemoglobin 7.5 mg/dL).  Her postpartum course was unremarkable. She was given another transfusion of 1 unit pRBCs on POD#1.  She had stable vitials since that time and had no orthostatic symptoms on day of discharge.  Her day of discharge hemoglobin was 6.9 and was 7.9 on 9/26 (1 unit of hemoglobin in 2 days with no symptoms). Discharged with instructions to take iron. She was also started on labetalol on day of discharge and had an improvement in her BP levels. Strict BP precautions given.  BP and incision check in one week.   Physical exam  Vitals:   02/23/16 0816 02/23/16 1210 02/23/16 1225 02/23/16 1700  BP: (!) 162/92  (!) 143/85 139/82  Pulse: 77  87   Resp: 20     Temp: 98.5 F (36.9 C) 98.6 F (37 C)    TempSrc: Oral     SpO2:   100%   Weight:      Height:       General: alert, cooperative and no distress Lochia: appropriate Uterine  Fundus: firm Incision: Healing well with no significant drainage, Dressing is clean, dry, and intact DVT Evaluation: No evidence of DVT seen on physical exam.  Labs: Lab Results  Component Value Date   WBC 12.3 (H) 02/23/2016   HGB 6.9 (L) 02/23/2016   HCT 21.3 (L) 02/23/2016   MCV 69.8 (L) 02/23/2016   PLT 176 02/23/2016   CMP Latest Ref Rng & Units 02/20/2016  Glucose 65 - 99 mg/dL 72  BUN 6 - 20 mg/dL 6  Creatinine 4.09 - 8.11 mg/dL 9.14  Sodium 782 - 956 mmol/L 135  Potassium 3.5 - 5.1 mmol/L 3.1(L)  Chloride 101 - 111 mmol/L 106  CO2 22 - 32 mmol/L 22  Calcium 8.9 - 10.3 mg/dL 2.1(H)  Total Protein 6.5 - 8.1 g/dL 6.6  Total Bilirubin 0.3 - 1.2 mg/dL 0.7  Alkaline Phos 38 - 126 U/L 137(H)  AST 15 - 41 U/L 24  ALT 14 - 54 U/L 11(L)    Discharge instruction: per After Visit  Summary.  Medications:    Medication List    TAKE these medications   albuterol 108 (90 Base) MCG/ACT inhaler Commonly known as:  PROVENTIL HFA;VENTOLIN HFA Inhale 2 puffs into the lungs every 6 (six) hours as needed for wheezing or shortness of breath.   ferrous sulfate 325 (65 FE) MG tablet Take 1 tablet (325 mg total) by mouth 2 (two) times daily with a meal.   ibuprofen 600 MG tablet Commonly known as:  ADVIL,MOTRIN Take 1 tablet (600 mg total) by mouth every 6 (six) hours.   labetalol 200 MG tablet Commonly known as:  NORMODYNE Take 1 tablet (200 mg total) by mouth 2 (two) times daily.   oxyCODONE-acetaminophen 5-325 MG tablet Commonly known as:  PERCOCET/ROXICET Take 2 tablets by mouth every 6 (six) hours as needed for moderate pain or severe pain.     Take PRENATAL Vitamin, as well.   Diet: routine diet  Activity: Advance as tolerated. Pelvic rest for 6 weeks.   Outpatient follow up: Follow-up Information    Conard NovakJackson, Aamina Skiff D, MD Follow up in 1 week(s).   Specialty:  Obstetrics and Gynecology Why:  incision check and BP check Contact information: [address] [place] [address] [phone]             Rhogam Given postpartum: no Rubella vaccine given postpartum: yes Varicella vaccine given postpartum: no  Newborn Data: Live born female  Birth Weight: 8 lb 5.3 oz (3780 g) APGAR: 8, 9   Disposition:home with mother  SIGNED: Thomasene MohairStephen Maleka Contino, MD 02/23/2016 5:08 PM

## 2016-02-20 NOTE — Op Note (Signed)
Cesarean Section Operative Note    Lisa KnudsenLatoshia C Cervenka   02/20/2016   Pre-operative Diagnosis:  1) intrauterine pregnancy at 6142w6d 2) gestational hypertension 3) history of prior cesarean delivery, desires repeat 4) severe anemia  Post-operative Diagnosis:  1) intrauterine pregnancy at 7642w6d 2) gestational hypertension 3) history of prior cesarean delivery, desires repeat 4) severe anemia  Procedure:  1) repeat low-transverse cesarean delivery via pfannenstiel incision with double layer uterine closure 2) transfusion of 1 unit packed Red Blood Cells  Surgeon: Surgeon(s) and Role:    * Conard NovakStephen D Mayank Teuscher, MD - Primary   Assistants: Farrel Connersolleen Gutierrez, CNM  Anesthesia: spinal   Findings:  1) normal appearing gravid uterus, fallopian tubes, and ovaries 2) viable female infant with weight of 3,780 grams, APGARs 8 and 9 3) adhesion of bladder to left anterior uterine wall   Estimated Blood Loss: 800 mL  Total IV Fluids:  1) 800 ml crystalloid 2) 1 unit pRBCs  Specimens: None  Complications: no complications  Disposition: PACU - hemodynamically stable.   Maternal Condition: stable   Baby condition / location:  Couplet care / Skin to Skin  Procedure Details:  The patient was seen in the Holding Room. The risks, benefits, complications, treatment options, and expected outcomes were discussed with the patient. The patient concurred with the proposed plan, giving informed consent. identified as Lisa KnudsenLatoshia C Cerezo and the procedure verified as C-Section Delivery. A Time Out was held and the above information confirmed.   After induction of anesthesia, the patient was draped and prepped in the usual sterile manner. A Pfannenstiel incision was made and carried down through the subcutaneous tissue to the fascia. Fascial incision was made and extended transversely. The fascia was separated from the underlying rectus tissue superiorly and inferiorly. The peritoneum was identified and  entered. Peritoneal incision was extended longitudinally. The bladder flap was sharply freed from the lower uterine segment. A low transverse uterine incision was made and the hysterotomy was extended with cranial-caudal tension. Delivered from cephalic presentation was a 3,780 gram Living newborn infant(s) or Female with Apgar scores of 8 at one minute and 9 at five minutes. Cord ph was not sent the umbilical cord was clamped and cut cord blood was obtained for evaluation. The placenta was removed Intact and appeared normal. The uterine outline, tubes and ovaries appeared normal. The uterine incision was closed with running locked sutures of 0 Vicryl.  A second layer of the same suture was thrown in an imbricating fashion.  Hemostasis was assured.  The uterus was returned to the abdomen and the paracolic gutters were cleared of all clots and debris.  The rectus muscles were inspected and found to be hemostatic.  The On-Q catheter pumps were inserted in accordance with the manufacturer's recommendations.  The catheters were inserted approximately 4cm cephelad to the incision line, approximately 1cm apart, straddling the midline.  They were inserted to a depth of the 4th mark. They were positioned superficial to the rectus abdominus muscles and deep to the rectus fascia.    The fascia was then reapproximated with running sutures of 1-0 PDS, looped. The subcutaneous tissue was reapproximated using 2-0 plain gut such that no greater than 2cm of dead space remained. The subcuticular closure was performed using 4-0 monocryl. The skin closure was reinforced using benzoin and 1/2" steri-strips.  The On-Q catheters were bolused with 5 mL of 0.5% marcaine plain for a total of 10 mL.  The catheters were affixed to the skin with surgical  skin glue, steri-strips, and tegaderm.    Instrument, sponge, and needle counts were correct prior the abdominal closure and were correct at the conclusion of the case.  The patient  received Ancef 3 gram IV prior to skin incision (within 30 minutes). For VTE prophylaxis she was wearing SCDs throughout the case.  The patient had an initial hemoglobin of 7.5mg /dL.  She had a normal amount of blood loss during the procedure. However, given her very low starting blood counts, mutual decision was made between the surgeon and anesthesia to transfuse one unit pRBCs.   Signed: Conard Novak, MD 02/20/2016 11:51 PM

## 2016-02-20 NOTE — Anesthesia Procedure Notes (Signed)
Spinal  Patient location during procedure: OR Start time: 02/20/2016 10:27 PM End time: 02/20/2016 10:29 PM Staffing Anesthesiologist: Lenard SimmerKARENZ, ANDREW Performed: anesthesiologist  Preanesthetic Checklist Completed: patient identified, site marked, surgical consent, pre-op evaluation, timeout performed, IV checked, risks and benefits discussed and monitors and equipment checked Spinal Block Patient position: sitting Prep: ChloraPrep Patient monitoring: heart rate, continuous pulse ox and blood pressure Approach: midline Location: L3-4 Injection technique: single-shot Needle Needle type: Whitacre and Introducer  Needle gauge: 24 G Needle length: 9 cm Assessment Sensory level: T6 Additional Notes No complications

## 2016-02-20 NOTE — H&P (Signed)
OB History & Physical   History of Present Illness:  Chief Complaint: elevated blood pressures in clinic  HPI:  Lisa Richard is a 30 y.o. [id number] female at [redacted]w[redacted]d dated by LMP consistent with a 7-week ultrasound.  Her pregnancy has been complicated by obesity with initial BMI 40, history of cesaeran delivery x 2.    She denies contractions.   She denies leakage of fluid.   She denies vaginal bleeding.   She reports fetal movement.   She presented to clinic today for an AFI and NST.  She was noted to have elevated blood pressures.  She denies headaches, visual disturbances, and RUQ pain.    Maternal Medical History:  Past Medical History: dysmetabolic syndrome x.  Past Surgical History:  Procedure Laterality Date  . CESAREAN SECTION      x 2   Allergies: No Known Allergies  Prior to Admission medications   Medication Sig Start Date End Date Taking? Authorizing Provider  albuterol (PROVENTIL HFA;VENTOLIN HFA) 108 (90 BASE) MCG/ACT inhaler Inhale 2 puffs into the lungs every 6 (six) hours as needed for wheezing or shortness of breath. Patient not taking: Reported on 02/20/2016 05/06/15   Darci Current, MD    OB History  Gravida Para Term Preterm AB Living  4 2 2   1 2   SAB TAB Ectopic Multiple Live Births  1       2    # Outcome Date GA Lbr Len/2nd Weight Sex Delivery Anes PTL Lv  4 Current           3 Term 12/05/11    F CS-LTranv  N LIV  2 Term 05/31/09    M CS-LTranv  N LIV     Complications: GDM (gestational diabetes mellitus)  1 SAB 2009              Prenatal care site: [facility]  Social History: She  reports that she has quit smoking. She has quit using smokeless tobacco. She reports that she does not drink alcohol or use drugs.  Family History: family history is not on file.   Review of Systems: Negative x 10 systems reviewed except as noted in the HPI.    Physical Exam:  Vital Signs: BP 133/72 (BP Location: Right Arm)   Pulse 76   Temp 98.9 F (37.2  C) (Oral)   Resp 18   LMP 05/24/2015 (Exact Date)  (range with SBPs into 150s) General: no acute distress.  HEENT: normocephalic, atraumatic Heart: regular rate & rhythm.  No murmurs/rubs/gallops Lungs: clear to auscultation bilaterally Abdomen: soft, gravid, non-tender;  EFW: 8.5 pounds Pelvic: deferred Extremities: non-tender, symmetric, no edema bilaterally.  DTRs: 2+  Neurologic: Alert & oriented x 3.    Pertinent Results:  Prenatal Labs: Blood type/Rh O positive  Antibody screen negative  Rubella Not immune  Varicella Immune    RPR NR  HBsAg negative  HIV negative  GC negative  Chlamydia negative  Genetic screening declined  1 hour GTT Early 1h gtt 127, 28 wk 1 h gtt 124  3 hour GTT n/a  GBS positive on 02/09/16   Baseline FHR: 135 beats/min   Variability: moderate   Accelerations: present   Decelerations: present (?late deceleration) Contractions: present frequency: 1 q 10 minutes Overall assessment: overall category 1  Assessment:  Lisa Richard is a 30 y.o. [id number] female at [redacted]w[redacted]d with history of cesarean delivery x 2, obesity with BMI > 40, and at least gestational hypertension.  Move to cesarean delivery due to gestational age with gestational hypertension and history of cesarean delivery.  History of anemia in pregnancy with hct 26 at 28 weeks.   Plan:  1. Admit to Labor & Delivery  2. CBC, T&S, NPO, IVF 3. GBS positive.   4. Fetwal well-being: reassuring overall 5. To OR for repeat cesarean. Labs pending.     Conard NovakJackson, Shamona Wirtz D, MD 02/20/2016 6:47 PM

## 2016-02-20 NOTE — Anesthesia Preprocedure Evaluation (Signed)
Anesthesia Evaluation  Patient identified by MRN, date of birth, ID band Patient awake    Reviewed: Allergy & Precautions, H&P , NPO status , Patient's Chart, lab work & pertinent test results, reviewed documented beta blocker date and time   History of Anesthesia Complications Negative for: history of anesthetic complications  Airway Mallampati: IV  TM Distance: >3 FB Neck ROM: full    Dental no notable dental hx. (+) Teeth Intact   Pulmonary neg pulmonary ROS, former smoker,    Pulmonary exam normal breath sounds clear to auscultation       Cardiovascular Exercise Tolerance: Good negative cardio ROS Normal cardiovascular exam Rhythm:regular Rate:Normal     Neuro/Psych negative neurological ROS  negative psych ROS   GI/Hepatic negative GI ROS, Neg liver ROS,   Endo/Other  diabetes (gest DM with 1st pregnancy, no issues currently)Morbid obesity  Renal/GU negative Renal ROS  negative genitourinary   Musculoskeletal   Abdominal   Peds  Hematology negative hematology ROS (+)   Anesthesia Other Findings History reviewed. No pertinent past medical history.   Reproductive/Obstetrics (+) Pregnancy                             Anesthesia Physical Anesthesia Plan  ASA: III  Anesthesia Plan: Spinal   Post-op Pain Management:    Induction:   Airway Management Planned:   Additional Equipment:   Intra-op Plan:   Post-operative Plan:   Informed Consent: I have reviewed the patients History and Physical, chart, labs and discussed the procedure including the risks, benefits and alternatives for the proposed anesthesia with the patient or authorized representative who has indicated his/her understanding and acceptance.   Dental Advisory Given  Plan Discussed with: Anesthesiologist, CRNA and Surgeon  Anesthesia Plan Comments:         Anesthesia Quick Evaluation

## 2016-02-21 ENCOUNTER — Encounter: Payer: Self-pay | Admitting: Obstetrics and Gynecology

## 2016-02-21 DIAGNOSIS — O99824 Streptococcus B carrier state complicating childbirth: Secondary | ICD-10-CM | POA: Diagnosis present

## 2016-02-21 DIAGNOSIS — O34211 Maternal care for low transverse scar from previous cesarean delivery: Secondary | ICD-10-CM | POA: Diagnosis present

## 2016-02-21 DIAGNOSIS — D509 Iron deficiency anemia, unspecified: Secondary | ICD-10-CM | POA: Diagnosis present

## 2016-02-21 DIAGNOSIS — Z23 Encounter for immunization: Secondary | ICD-10-CM | POA: Diagnosis not present

## 2016-02-21 DIAGNOSIS — O99214 Obesity complicating childbirth: Secondary | ICD-10-CM | POA: Diagnosis present

## 2016-02-21 DIAGNOSIS — O134 Gestational [pregnancy-induced] hypertension without significant proteinuria, complicating childbirth: Secondary | ICD-10-CM | POA: Diagnosis present

## 2016-02-21 DIAGNOSIS — Z87891 Personal history of nicotine dependence: Secondary | ICD-10-CM | POA: Diagnosis not present

## 2016-02-21 DIAGNOSIS — D62 Acute posthemorrhagic anemia: Secondary | ICD-10-CM | POA: Diagnosis present

## 2016-02-21 DIAGNOSIS — O9902 Anemia complicating childbirth: Secondary | ICD-10-CM | POA: Diagnosis present

## 2016-02-21 DIAGNOSIS — Z3A38 38 weeks gestation of pregnancy: Secondary | ICD-10-CM | POA: Diagnosis not present

## 2016-02-21 DIAGNOSIS — O99284 Endocrine, nutritional and metabolic diseases complicating childbirth: Secondary | ICD-10-CM | POA: Diagnosis present

## 2016-02-21 DIAGNOSIS — Z6839 Body mass index (BMI) 39.0-39.9, adult: Secondary | ICD-10-CM | POA: Diagnosis not present

## 2016-02-21 LAB — CBC
HCT: 22.2 % — ABNORMAL LOW (ref 35.0–47.0)
HCT: 24.6 % — ABNORMAL LOW (ref 35.0–47.0)
HEMOGLOBIN: 6.8 g/dL — AB (ref 12.0–16.0)
HEMOGLOBIN: 7.9 g/dL — AB (ref 12.0–16.0)
MCH: 20.6 pg — AB (ref 26.0–34.0)
MCH: 22.5 pg — AB (ref 26.0–34.0)
MCHC: 30.8 g/dL — AB (ref 32.0–36.0)
MCHC: 32.1 g/dL (ref 32.0–36.0)
MCV: 67 fL — AB (ref 80.0–100.0)
MCV: 70.1 fL — AB (ref 80.0–100.0)
Platelets: 146 10*3/uL — ABNORMAL LOW (ref 150–440)
Platelets: 147 10*3/uL — ABNORMAL LOW (ref 150–440)
RBC: 3.31 MIL/uL — ABNORMAL LOW (ref 3.80–5.20)
RBC: 3.5 MIL/uL — AB (ref 3.80–5.20)
RDW: 22.6 % — ABNORMAL HIGH (ref 11.5–14.5)
RDW: 25.4 % — ABNORMAL HIGH (ref 11.5–14.5)
WBC: 13.9 10*3/uL — ABNORMAL HIGH (ref 3.6–11.0)
WBC: 12.7 10*3/uL — ABNORMAL HIGH (ref 3.6–11.0)

## 2016-02-21 LAB — PREPARE RBC (CROSSMATCH)

## 2016-02-21 MED ORDER — BUPIVACAINE 0.25 % ON-Q PUMP DUAL CATH 400 ML
INJECTION | Status: AC
  Filled 2016-02-21: qty 400

## 2016-02-21 MED ORDER — LACTATED RINGERS IV SOLN
INTRAVENOUS | Status: DC
  Administered 2016-02-21: 13:00:00 via INTRAVENOUS

## 2016-02-21 MED ORDER — NALOXONE HCL 0.4 MG/ML IJ SOLN: 0.4000 mg | INTRAMUSCULAR | Status: DC | PRN

## 2016-02-21 MED ORDER — OXYCODONE-ACETAMINOPHEN 5-325 MG PO TABS
1.0000 | ORAL_TABLET | ORAL | Status: DC | PRN
  Administered 2016-02-21 (×3): 2 via ORAL
  Administered 2016-02-22: 1 via ORAL
  Administered 2016-02-22 (×3): 2 via ORAL
  Administered 2016-02-22 (×2): 1 via ORAL
  Administered 2016-02-23: 2 via ORAL
  Filled 2016-02-21 (×2): qty 2
  Filled 2016-02-21: qty 1
  Filled 2016-02-21 (×3): qty 2
  Filled 2016-02-21: qty 1
  Filled 2016-02-21 (×2): qty 2
  Filled 2016-02-21: qty 1

## 2016-02-21 MED ORDER — METHYLERGONOVINE MALEATE 0.2 MG/ML IJ SOLN
0.2000 mg | Freq: Once | INTRAMUSCULAR | Status: AC
  Administered 2016-02-21: 0.2 mg via INTRAMUSCULAR

## 2016-02-21 MED ORDER — METHYLERGONOVINE MALEATE 0.2 MG/ML IJ SOLN
INTRAMUSCULAR | Status: AC
  Administered 2016-02-21: 0.2 mg via INTRAMUSCULAR
  Filled 2016-02-21: qty 1

## 2016-02-21 MED ORDER — DIPHENHYDRAMINE HCL 25 MG PO CAPS
25.0000 mg | ORAL_CAPSULE | Freq: Once | ORAL | Status: AC
  Administered 2016-02-21: 25 mg via ORAL

## 2016-02-21 MED ORDER — DIPHENHYDRAMINE HCL 25 MG PO CAPS
25.0000 mg | ORAL_CAPSULE | ORAL | Status: DC | PRN
  Filled 2016-02-21: qty 1

## 2016-02-21 MED ORDER — MENTHOL 3 MG MT LOZG
1.0000 | LOZENGE | OROMUCOSAL | Status: DC | PRN
  Filled 2016-02-21: qty 9

## 2016-02-21 MED ORDER — OXYTOCIN 40 UNITS IN LACTATED RINGERS INFUSION - SIMPLE MED
INTRAVENOUS | Status: AC
  Administered 2016-02-21: 62.5 mL/h
  Filled 2016-02-21: qty 1000

## 2016-02-21 MED ORDER — SODIUM CHLORIDE 0.9 % IV SOLN
INTRAVENOUS | Status: DC | PRN
  Administered 2016-02-20: via INTRAVENOUS

## 2016-02-21 MED ORDER — FERROUS SULFATE 325 (65 FE) MG PO TABS
325.0000 mg | ORAL_TABLET | Freq: Two times a day (BID) | ORAL | Status: DC
  Administered 2016-02-21 – 2016-02-23 (×6): 325 mg via ORAL
  Filled 2016-02-21 (×6): qty 1

## 2016-02-21 MED ORDER — DIPHENHYDRAMINE HCL 25 MG PO CAPS
25.0000 mg | ORAL_CAPSULE | Freq: Four times a day (QID) | ORAL | Status: DC | PRN
  Administered 2016-02-21: 25 mg via ORAL
  Filled 2016-02-21: qty 1

## 2016-02-21 MED ORDER — OXYTOCIN 40 UNITS IN LACTATED RINGERS INFUSION - SIMPLE MED: 2.5000 [IU]/h | INTRAVENOUS | Status: DC

## 2016-02-21 MED ORDER — SODIUM CHLORIDE 0.9 % IV SOLN: Freq: Once | INTRAVENOUS | Status: DC

## 2016-02-21 MED ORDER — OXYCODONE-ACETAMINOPHEN 5-325 MG PO TABS: 1.0000 | ORAL_TABLET | ORAL | Status: DC | PRN

## 2016-02-21 MED ORDER — KETOROLAC TROMETHAMINE 30 MG/ML IJ SOLN
30.0000 mg | Freq: Four times a day (QID) | INTRAMUSCULAR | Status: DC | PRN
  Administered 2016-02-21: 30 mg via INTRAVENOUS
  Filled 2016-02-21: qty 1

## 2016-02-21 MED ORDER — ONDANSETRON HCL 4 MG/2ML IJ SOLN: 4.0000 mg | Freq: Three times a day (TID) | INTRAMUSCULAR | Status: DC | PRN

## 2016-02-21 MED ORDER — PRENATAL MULTIVITAMIN CH
1.0000 | ORAL_TABLET | Freq: Every day | ORAL | Status: DC
  Administered 2016-02-21 – 2016-02-23 (×3): 1 via ORAL
  Filled 2016-02-21 (×3): qty 1

## 2016-02-21 MED ORDER — COCONUT OIL OIL: 1.0000 "application " | TOPICAL_OIL | Status: DC | PRN

## 2016-02-21 MED ORDER — BUPIVACAINE ON-Q PAIN PUMP (FOR ORDER SET NO CHG)
INJECTION | Status: DC
  Filled 2016-02-21: qty 1

## 2016-02-21 MED ORDER — WITCH HAZEL-GLYCERIN EX PADS: 1.0000 "application " | MEDICATED_PAD | CUTANEOUS | Status: DC | PRN

## 2016-02-21 MED ORDER — NALBUPHINE HCL 10 MG/ML IJ SOLN: 5.0000 mg | INTRAMUSCULAR | Status: DC | PRN

## 2016-02-21 MED ORDER — NALOXONE HCL 2 MG/2ML IJ SOSY: 1.0000 ug/kg/h | PREFILLED_SYRINGE | INTRAMUSCULAR | Status: DC | PRN

## 2016-02-21 MED ORDER — SIMETHICONE 80 MG PO CHEW
80.0000 mg | CHEWABLE_TABLET | Freq: Three times a day (TID) | ORAL | Status: DC
  Administered 2016-02-21 – 2016-02-23 (×7): 80 mg via ORAL
  Filled 2016-02-21 (×7): qty 1

## 2016-02-21 MED ORDER — NALBUPHINE HCL 10 MG/ML IJ SOLN: 5.0000 mg | Freq: Once | INTRAMUSCULAR | Status: DC | PRN

## 2016-02-21 MED ORDER — DIPHENHYDRAMINE HCL 50 MG/ML IJ SOLN: 12.5000 mg | INTRAMUSCULAR | Status: DC | PRN

## 2016-02-21 MED ORDER — DIBUCAINE 1 % RE OINT: 1.0000 "application " | TOPICAL_OINTMENT | RECTAL | Status: DC | PRN

## 2016-02-21 MED ORDER — MEPERIDINE HCL 25 MG/ML IJ SOLN: 6.2500 mg | INTRAMUSCULAR | Status: DC | PRN

## 2016-02-21 MED ORDER — KETOROLAC TROMETHAMINE 30 MG/ML IJ SOLN: 30.0000 mg | Freq: Four times a day (QID) | INTRAMUSCULAR | Status: DC | PRN

## 2016-02-21 MED ORDER — SODIUM CHLORIDE 0.9% FLUSH: 3.0000 mL | INTRAVENOUS | Status: DC | PRN

## 2016-02-21 MED ORDER — SODIUM CHLORIDE 0.9 % IV SOLN
Freq: Once | INTRAVENOUS | Status: AC
  Administered 2016-02-21: 09:00:00 via INTRAVENOUS

## 2016-02-21 MED ORDER — IBUPROFEN 600 MG PO TABS
600.0000 mg | ORAL_TABLET | Freq: Four times a day (QID) | ORAL | Status: DC
  Administered 2016-02-22 – 2016-02-23 (×7): 600 mg via ORAL
  Filled 2016-02-21 (×7): qty 1

## 2016-02-21 MED ORDER — SENNOSIDES-DOCUSATE SODIUM 8.6-50 MG PO TABS
2.0000 | ORAL_TABLET | ORAL | Status: DC
  Administered 2016-02-21 – 2016-02-23 (×3): 2 via ORAL
  Filled 2016-02-21 (×3): qty 2

## 2016-02-21 MED ORDER — MEASLES, MUMPS & RUBELLA VAC ~~LOC~~ INJ
0.5000 mL | INJECTION | Freq: Once | SUBCUTANEOUS | Status: AC
  Administered 2016-02-23: 0.5 mL via SUBCUTANEOUS
  Filled 2016-02-21 (×3): qty 0.5

## 2016-02-21 MED ORDER — OXYCODONE-ACETAMINOPHEN 5-325 MG PO TABS
2.0000 | ORAL_TABLET | ORAL | Status: DC | PRN
  Administered 2016-02-21: 2 via ORAL

## 2016-02-21 NOTE — Progress Notes (Signed)
  Post-operative Day 1  Subjective: Pt tearful during assessment, declines problems. Waiting on breakfast tray. Denies sx for anemia while resting.   Receiving 1 unit of blood currently  Objective: Blood pressure (!) 168/92, pulse 74, temperature 98.3 F (36.8 C), temperature source Oral, resp. rate 16, height 5\' 1"  (1.549 m), weight 208 lb (94.3 kg), last menstrual period 05/24/2015, SpO2 100 %.  Physical Exam:  General: alert and cooperative Lochia: appropriate Uterine Fundus: firm Incision: healing well DVT Evaluation: No evidence of DVT seen on physical exam. Abdomen: soft, NT   Recent Labs  02/20/16 1819 02/21/16 0527  HGB 7.7* 6.8*  HCT 25.2* 22.2*    Assessment POD #1, Acute blood loss and chronic iron-deficiency anemia  Plan: Continue PO care and Fe replacement, anemia precautions Recheck CBC 4 hours after unit completed   Blood Type: O+ RNI/VI    Lisa Richard, Lisa Richard, [place] 02/21/2016, 10:38 AM

## 2016-02-21 NOTE — Anesthesia Post-op Follow-up Note (Signed)
  Anesthesia Pain Follow-up Note  Patient: Smitty KnudsenLatoshia C Vanpatten  Day #: 1  Date of Follow-up: 02/21/2016 Time: 7:41 AM  Last Vitals:  Vitals:   02/21/16 0453 02/21/16 0729  BP: 140/79 (!) 148/85  Pulse: 93 72  Resp: 18 18  Temp: 37 C 36.9 C    Level of Consciousness: alert  Pain: none   Side Effects:None  Catheter Site Exam:clean, dry     Plan: D/C from anesthesia care  Marlana SalvageSandra Mora Pedraza

## 2016-02-21 NOTE — Anesthesia Postprocedure Evaluation (Signed)
Anesthesia Post Note  Patient: Lisa Richard  Procedure(s) Performed: Procedure(s) (LRB): CESAREAN SECTION (N/A)  Patient location during evaluation: Mother Baby Anesthesia Type: Spinal Level of consciousness: awake, awake and alert and oriented Pain management: pain level controlled Vital Signs Assessment: post-procedure vital signs reviewed and stable Respiratory status: spontaneous breathing, nonlabored ventilation and respiratory function stable Cardiovascular status: stable Postop Assessment: no signs of nausea or vomiting, adequate PO intake and patient able to bend at knees Anesthetic complications: no    Last Vitals:  Vitals:   02/21/16 0453 02/21/16 0729  BP: 140/79 (!) 148/85  Pulse: 93 72  Resp: 18 18  Temp: 37 C 36.9 C    Last Pain:  Vitals:   02/21/16 0729  TempSrc: Oral  PainSc:                  Marlana SalvageSandra Rance Smithson

## 2016-02-21 NOTE — Transfer of Care (Signed)
Immediate Anesthesia Transfer of Care Note  Patient: Lisa KnudsenLatoshia C Salman  Procedure(s) Performed: Procedure(s): CESAREAN SECTION (N/A)  Patient Location: PACU  Anesthesia Type:Spinal  Level of Consciousness: awake, alert , oriented and patient cooperative  Airway & Oxygen Therapy: Patient Spontanous Breathing  Post-op Assessment: Report given to RN and Post -op Vital signs reviewed and stable  Post vital signs: Reviewed and stable  Last Vitals:  Vitals:   02/20/16 1932 02/21/16 0007  BP: 135/71 127/68  Pulse: 82 71  Resp: 16 16  Temp: 36.9 C 36.4 C    Last Pain:  Vitals:   02/21/16 0007  TempSrc: Oral  PainSc:          Complications: No apparent anesthesia complications

## 2016-02-22 LAB — TYPE AND SCREEN
ABO/RH(D): O POS
ANTIBODY SCREEN: NEGATIVE
Unit division: 0
Unit division: 0

## 2016-02-22 LAB — RPR: RPR: NONREACTIVE

## 2016-02-22 NOTE — Progress Notes (Addendum)
Subjective:  Doing well, no concerns, moderate lochia.  Denies HA or vision changes.   Objective:  Blood pressure (!) 148/82, pulse 73, temperature 97.9 F (36.6 C), temperature source Oral, resp. rate 18, height 5' 1"  (1.549 m), weight 208 lb (94.3 kg), last menstrual period 05/24/2015, SpO2 100 %.  General: NAD Pulmonary: no increased work of breathing Abdomen: non-distended, non-tender, fundus firm at level of umbilicus Incision: D/C/I Extremities: no edema, no erythema, no tenderness  Results for orders placed or performed during the hospital encounter of 02/20/16 (from the past 72 hour(s))  Comprehensive metabolic panel     Status: Abnormal   Collection Time: 02/20/16  6:19 PM  Result Value Ref Range   Sodium 135 135 - 145 mmol/L   Potassium 3.1 (L) 3.5 - 5.1 mmol/L   Chloride 106 101 - 111 mmol/L   CO2 22 22 - 32 mmol/L   Glucose, Bld 72 65 - 99 mg/dL   BUN 6 6 - 20 mg/dL   Creatinine, Ser 0.48 0.44 - 1.00 mg/dL   Calcium 8.5 (L) 8.9 - 10.3 mg/dL   Total Protein 6.6 6.5 - 8.1 g/dL   Albumin 2.5 (L) 3.5 - 5.0 g/dL   AST 24 15 - 41 U/L   ALT 11 (L) 14 - 54 U/L   Alkaline Phosphatase 137 (H) 38 - 126 U/L   Total Bilirubin 0.7 0.3 - 1.2 mg/dL   GFR calc non Af Amer >60 >60 mL/min   GFR calc Af Amer >60 >60 mL/min    Comment: (NOTE) The eGFR has been calculated using the CKD EPI equation. This calculation has not been validated in all clinical situations. eGFR's persistently <60 mL/min signify possible Chronic Kidney Disease.    Anion gap 7 5 - 15  CBC     Status: Abnormal   Collection Time: 02/20/16  6:19 PM  Result Value Ref Range   WBC 7.5 3.6 - 11.0 K/uL   RBC 3.93 3.80 - 5.20 MIL/uL   Hemoglobin 7.7 (L) 12.0 - 16.0 g/dL   HCT 25.2 (L) 35.0 - 47.0 %   MCV 64.1 (L) 80.0 - 100.0 fL   MCH 19.6 (L) 26.0 - 34.0 pg   MCHC 30.5 (L) 32.0 - 36.0 g/dL   RDW 19.8 (H) 11.5 - 14.5 %   Platelets 167 150 - 440 K/uL  RPR     Status: None   Collection Time: 02/20/16  6:19  PM  Result Value Ref Range   RPR Ser Ql Non Reactive Non Reactive    Comment: (NOTE) Performed At: [facility] [place], [place] [id number] Lindon Romp MD IW:[id number]   ABO/Rh     Status: None   Collection Time: 02/20/16  6:19 PM  Result Value Ref Range   ABO/RH(D) O POS   Type and screen     Status: None (Preliminary result)   Collection Time: 02/20/16  6:23 PM  Result Value Ref Range   ABO/RH(D) O POS    Antibody Screen NEG    Sample Expiration 02/23/2016    Unit Number [id number]    Blood Component Type RED CELLS,LR    Unit division 00    Status of Unit ISSUED,FINAL    Transfusion Status OK TO TRANSFUSE    Crossmatch Result Compatible    Unit Number [id number]    Blood Component Type RBC LR PHER2    Unit division 00    Status of Unit ISSUED    Transfusion Status OK  TO TRANSFUSE    Crossmatch Result Compatible   Protein / creatinine ratio, urine     Status: None   Collection Time: 02/20/16  7:58 PM  Result Value Ref Range   Creatinine, Urine 69 mg/dL   Total Protein, Urine 7 mg/dL    Comment: NO NORMAL RANGE ESTABLISHED FOR THIS TEST   Protein Creatinine Ratio 0.10 0.00 - 0.15 mg/mg[Cre]  Prepare RBC     Status: None   Collection Time: 02/20/16  8:41 PM  Result Value Ref Range   Order Confirmation ORDER PROCESSED BY BLOOD BANK   CBC     Status: Abnormal   Collection Time: 02/21/16  5:27 AM  Result Value Ref Range   WBC 13.9 (H) 3.6 - 11.0 K/uL   RBC 3.31 (L) 3.80 - 5.20 MIL/uL   Hemoglobin 6.8 (L) 12.0 - 16.0 g/dL   HCT 22.2 (L) 35.0 - 47.0 %   MCV 67.0 (L) 80.0 - 100.0 fL   MCH 20.6 (L) 26.0 - 34.0 pg   MCHC 30.8 (L) 32.0 - 36.0 g/dL   RDW 22.6 (H) 11.5 - 14.5 %   Platelets 146 (L) 150 - 440 K/uL  Prepare RBC     Status: None   Collection Time: 02/21/16 11:15 AM  Result Value Ref Range   Order Confirmation ORDER PROCESSED BY BLOOD BANK   Prepare RBC     Status: None   Collection Time: 02/21/16 11:16 AM  Result Value Ref  Range   Order Confirmation ORDER PROCESSED BY BLOOD BANK   CBC     Status: Abnormal   Collection Time: 02/21/16  4:00 PM  Result Value Ref Range   WBC 12.7 (H) 3.6 - 11.0 K/uL   RBC 3.50 (L) 3.80 - 5.20 MIL/uL   Hemoglobin 7.9 (L) 12.0 - 16.0 g/dL   HCT 24.6 (L) 35.0 - 47.0 %   MCV 70.1 (L) 80.0 - 100.0 fL   MCH 22.5 (L) 26.0 - 34.0 pg   MCHC 32.1 32.0 - 36.0 g/dL   RDW 25.4 (H) 11.5 - 14.5 %   Platelets 147 (L) 150 - 440 K/uL     Assessment:   30 y.o. [id number] postoperativeday #2 RLTCS   Plan:  1) Acute blood loss anemia - hemodynamically stable and asymptomatic - po ferrous sulfate - came in at Hgb of 7.7 received 1U intraop went to 6.8 postop and had additional unit with rise to 7.9  2) --/--/O POS (09/25 1823) / Rubella Non-immune / Varicella Immune  3) GHTN - BP's mild range  4) Disposition - anticipate discharge PPD2

## 2016-02-23 ENCOUNTER — Other Ambulatory Visit: Payer: Medicaid Other

## 2016-02-23 LAB — CBC WITH DIFFERENTIAL/PLATELET
BASOS ABS: 0.1 10*3/uL (ref 0–0.1)
Basophils Relative: 1 %
Eosinophils Absolute: 0.2 10*3/uL (ref 0–0.7)
Eosinophils Relative: 2 %
HEMATOCRIT: 21.3 % — AB (ref 35.0–47.0)
HEMOGLOBIN: 6.9 g/dL — AB (ref 12.0–16.0)
LYMPHS PCT: 14 %
Lymphs Abs: 1.7 10*3/uL (ref 1.0–3.6)
MCH: 22.7 pg — ABNORMAL LOW (ref 26.0–34.0)
MCHC: 32.5 g/dL (ref 32.0–36.0)
MCV: 69.8 fL — AB (ref 80.0–100.0)
Monocytes Absolute: 0.7 10*3/uL (ref 0.2–0.9)
Monocytes Relative: 6 %
NEUTROS ABS: 9.6 10*3/uL — AB (ref 1.4–6.5)
Neutrophils Relative %: 77 %
Platelets: 176 10*3/uL (ref 150–440)
RBC: 3.05 MIL/uL — AB (ref 3.80–5.20)
RDW: 25.6 % — ABNORMAL HIGH (ref 11.5–14.5)
WBC: 12.3 10*3/uL — AB (ref 3.6–11.0)

## 2016-02-23 MED ORDER — FERROUS SULFATE 325 (65 FE) MG PO TABS: 325.0000 mg | ORAL_TABLET | Freq: Two times a day (BID) | ORAL | 3 refills | Status: DC

## 2016-02-23 MED ORDER — OXYCODONE-ACETAMINOPHEN 5-325 MG PO TABS: 2.0000 | ORAL_TABLET | Freq: Four times a day (QID) | ORAL | 0 refills | Status: DC | PRN

## 2016-02-23 MED ORDER — LABETALOL HCL 200 MG PO TABS
200.0000 mg | ORAL_TABLET | Freq: Two times a day (BID) | ORAL | Status: DC
  Administered 2016-02-23: 200 mg via ORAL
  Filled 2016-02-23: qty 1

## 2016-02-23 MED ORDER — LABETALOL HCL 200 MG PO TABS: 200.0000 mg | ORAL_TABLET | Freq: Two times a day (BID) | ORAL | 0 refills | Status: DC

## 2016-02-23 MED ORDER — IBUPROFEN 600 MG PO TABS: 600.0000 mg | ORAL_TABLET | Freq: Four times a day (QID) | ORAL | 0 refills | Status: DC

## 2016-02-23 NOTE — Progress Notes (Signed)
Patient discharged home with infant. Vital signs stable, bleeding within normal limits, uterus firm. Discharge instructions, prescriptions, and follow up appointment given to and reviewed with patient. Patient verbalized understanding, all questions answered. Escorted in wheelchair by nursing. Cleo Villamizar, RN     

## 2016-02-23 NOTE — Discharge Instructions (Signed)
Cesarean Delivery, Care After Refer to this sheet in the next few weeks. These instructions provide you with information on caring for yourself after your procedure. Your health care provider may also give you specific instructions. Your treatment has been planned according to current medical practices, but problems sometimes occur. Call your health care provider if you have any problems or questions after you go home. HOME CARE INSTRUCTIONS  Only take over-the-counter or prescription medications as directed by your health care provider.  Do not drink alcohol, especially if you are breastfeeding or taking medication to relieve pain.  Do not chew or smoke tobacco.  Continue to use good perineal care. Good perineal care includes:  Wiping your perineum from front to back.  Keeping your perineum clean.  Check your surgical cut (incision) daily for increased redness, drainage, swelling, or separation of skin.  Clean your incision gently with soap and water every day, and then pat it dry. If your health care provider says it is okay, leave the incision uncovered. Use a bandage (dressing) if the incision is draining fluid or appears irritated. If the adhesive strips across the incision do not fall off within 7 days, carefully peel them off.  Hug a pillow when coughing or sneezing until your incision is healed. This helps to relieve pain.  Do not use tampons or douche for the next 6 weeks.   Showers only, no tub baths for the next 6 weeks.   Wear a well-fitting bra that provides breast support.  Limit wearing support panties or control-top hose.  Drink enough fluids to keep your urine clear or pale yellow.  Eat high-fiber foods such as whole grain cereals and breads, brown rice, beans, and fresh fruits and vegetables every day. These foods may help prevent or relieve constipation.  Resume activities such as climbing stairs, driving, lifting, exercising, or traveling as directed by your  health care provider.  No heavy lifting or strenuous activity for the next 6 weeks.   No sexual intercourse for at least the next 6 weeks, and then not until you feel ready.   Try to have someone help you with your household activities and your newborn for at least a few days after you leave the hospital.  Rest as much as possible. Try to rest or take a nap when your newborn is sleeping.  Increase your activities gradually.  Keep all of your scheduled postpartum appointments. It is very important to keep your scheduled follow-up appointments. At these appointments, your health care provider will be checking to make sure that you are healing physically and emotionally. SEEK MEDICAL CARE IF:   You are passing large clots from your vagina. Save any clots to show your health care provider.  You have a foul smelling discharge from your vagina.  You have trouble urinating.  You are urinating frequently.  You have pain when you urinate.  You have a change in your bowel movements.  You have increasing redness, pain, or swelling near your incision.  You have pus draining from your incision.  Your incision is separating.  You have painful, hard, or reddened breasts.  You have a severe headache.  You have blurred vision or see spots.  You feel sad or depressed.  You have thoughts of hurting yourself or your newborn.  You have questions about your care, the care of your newborn, or medications.  You are dizzy or light-headed.  You have a rash.  You have pain, redness, or swelling at the  site of the removed intravenous access (IV) tube.  You have nausea or vomiting.  You stopped breastfeeding and have not had a menstrual period within 12 weeks of stopping.  You are not breastfeeding and have not had a menstrual period within 12 weeks of delivery.  You have a fever. SEEK IMMEDIATE MEDICAL CARE IF:  You have persistent pain.  You have chest pain.  You have shortness  of breath.  You faint.  You have leg pain.  You have stomach pain.  Your vaginal bleeding saturates 2 or more sanitary pads in 1 hour. MAKE SURE YOU:   Understand these instructions.  Will watch your condition.  Will get help right away if you are not doing well or get worse.   This information is not intended to replace advice given to you by your health care provider. Make sure you discuss any questions you have with your health care provider.   Document Released: 02/03/2002 Document Revised: 06/04/2014 Document Reviewed: 01/09/2012 Elsevier Interactive Patient Education [employer]

## 2016-02-23 NOTE — Progress Notes (Signed)
  Subjective:   Has had elevated blood pressure since delivery. PO labetalol started this morning. Otherwise feeling well. She is ambulating and voiding without difficulty. She is tolerating PO intake and her pain is well controlled with PO meds and OnQ Pump. Pt would like to go home today. We discussed the possibility of discharge today after seeing her response to BP medication. Pt denies HA, change of vision, epigastric pain.  Objective:  Blood pressure (!) 143/85, pulse 87, temperature 98.6 F (37 C), resp. rate 20, height 5\' 1"  (1.549 m), weight 208 lb (94.3 kg), last menstrual period 05/24/2015, SpO2 100 %.   General: NAD Pulmonary: no increased work of breathing Abdomen: non-distended, non-tender, fundus firm at level of umbilicus Incision: Waffle Dressing in place, no s/s of infection Extremities: no edema, no erythema, no tenderness    Assessment:   30 y.o. [id number] postoperativeday # 3   Plan:  1) Acute blood loss anemia - hemodynamically stable and asymptomatic - po ferrous sulfate/CBC  2) O+, Rubella non-immune (needs vaccine), Varicella Immune  3) TDAP status: given antepartum  4) GHTN BPs mild to severe range/PO Labetalol 200mg  BID   5) Bottle/Contraception:   6) Disposition: discharge dependent on BP response x2 readings 4 hrs apart to PO Labetalol   Lisa Richard, CNM

## 2016-02-24 ENCOUNTER — Inpatient Hospital Stay
Admission: RE | Admit: 2016-02-24 | Payer: Medicaid Other | Source: Ambulatory Visit | Admitting: Obstetrics & Gynecology

## 2016-02-24 ENCOUNTER — Encounter: Admission: RE | Payer: Self-pay | Source: Ambulatory Visit

## 2016-02-24 SURGERY — Surgical Case
Anesthesia: Choice

## 2016-08-08 ENCOUNTER — Ambulatory Visit (INDEPENDENT_AMBULATORY_CARE_PROVIDER_SITE_OTHER): Payer: Self-pay | Admitting: Obstetrics & Gynecology

## 2016-08-08 ENCOUNTER — Encounter: Payer: Self-pay | Admitting: Obstetrics & Gynecology

## 2016-08-08 VITALS — BP 120/80 | HR 87 | Wt 216.0 lb

## 2016-08-08 DIAGNOSIS — O0932 Supervision of pregnancy with insufficient antenatal care, second trimester: Secondary | ICD-10-CM | POA: Insufficient documentation

## 2016-08-08 DIAGNOSIS — O0990 Supervision of high risk pregnancy, unspecified, unspecified trimester: Secondary | ICD-10-CM

## 2016-08-08 DIAGNOSIS — Z6841 Body Mass Index (BMI) 40.0 and over, adult: Secondary | ICD-10-CM

## 2016-08-08 DIAGNOSIS — Z98891 History of uterine scar from previous surgery: Secondary | ICD-10-CM | POA: Insufficient documentation

## 2016-08-08 DIAGNOSIS — Z3A17 17 weeks gestation of pregnancy: Secondary | ICD-10-CM

## 2016-08-08 DIAGNOSIS — O99210 Obesity complicating pregnancy, unspecified trimester: Secondary | ICD-10-CM | POA: Insufficient documentation

## 2016-08-08 DIAGNOSIS — Z8759 Personal history of other complications of pregnancy, childbirth and the puerperium: Secondary | ICD-10-CM

## 2016-08-08 LAB — POCT URINE PREGNANCY: Preg Test, Ur: POSITIVE — AB

## 2016-08-08 NOTE — Progress Notes (Signed)
08/08/2016   Chief Complaint: Missed period  History of Present Illness: Lisa Richard is a 31 y.o. [id number] [redacted]w[redacted]d based on Patient's last menstrual period was 04/06/2016 (approximate).  (ONLY PERIOD SINCE LAST DELIVER with an Estimated Date of Delivery: 01/11/17, with the above CC. Preg complicated by has History of 3 cesarean sections; High risk pregnancy, antepartum; Late prenatal care affecting pregnancy in second trimester; History of gestational hypertension; Obesity affecting pregnancy, antepartum; and BMI 40.0-44.9, adult (HCC) on her problem list..  Her periods were: once since delivery She was using no method when she conceived.  She has Negative signs or symptoms of nausea/vomiting of pregnancy. She has Negative signs or symptoms of miscarriage or preterm labor She identifies Negative Zika risk factors for her and her partner On any different medications around the time she conceived/early pregnancy: No  History of varicella: Yes   ROS: A 12-point review of systems was performed and negative, except as stated in the above HPI.  OBGYN History: As per HPI. OB History  Gravida Para Term Preterm AB Living  5 3 3   1 2   SAB TAB Ectopic Multiple Live Births  1       3    # Outcome Date GA Lbr Len/2nd Weight Sex Delivery Anes PTL Lv  5 Current           4 Term 12/05/11    F CS-LTranv  N LIV  3 Term 05/31/09    M CS-LTranv  N LIV     Complications: GDM (gestational diabetes mellitus)  2 SAB 2009          1 Term 03/20/2017 40    CS         Any issues with any prior pregnancies: CS, Gest HTN Any prior children are healthy, doing well, without any problems or issues: yes History of pap smears: Yes. Last pap smear 2017. Abnormal: no  History of STIs: No   Past Medical History: No past medical history on file.  Past Surgical History: Past Surgical History:  Procedure Laterality Date  . CESAREAN SECTION      x 2  . CESAREAN SECTION N/A 02/20/2016   Procedure: CESAREAN SECTION;   Surgeon: Conard Novak, MD;  Location: [facility] ORS;  Service: Obstetrics;  Laterality: N/A;    Family History:  No family history on file. She denies any female cancers, bleeding or blood clotting disorders.  She denies any history of mental retardation, birth defects or genetic disorders in her or the FOB's history  Social History:  Social History   Social History  . Marital status: Single    Spouse name: N/A  . Number of children: N/A  . Years of education: N/A   Occupational History  . Not on file.   Social History Main Topics  . Smoking status: Former Games developer  . Smokeless tobacco: Former Neurosurgeon  . Alcohol use No  . Drug use: No  . Sexual activity: Yes    Birth control/ protection: None   Other Topics Concern  . Not on file   Social History Narrative  . No narrative on file   Any pets in the household: no  Allergy: No Known Allergies  Current Outpatient Medications: No current outpatient prescriptions on file.  Review of Systems  Constitutional: Negative for chills, fever and malaise/fatigue.  HENT: Negative for congestion, sinus pain and sore throat.   Eyes: Negative for blurred vision and pain.  Respiratory: Negative for cough and wheezing.  Cardiovascular: Negative for chest pain and leg swelling.  Gastrointestinal: Negative for abdominal pain, constipation, diarrhea, heartburn, nausea and vomiting.  Genitourinary: Negative for dysuria, frequency, hematuria and urgency.  Musculoskeletal: Negative for back pain, joint pain, myalgias and neck pain.  Skin: Negative for itching and rash.  Neurological: Negative for dizziness, tremors and weakness.  Endo/Heme/Allergies: Does not bruise/bleed easily.  Psychiatric/Behavioral: Negative for depression. The patient is not nervous/anxious and does not have insomnia.     Physical Exam:   BP 120/80   Pulse 87   Wt 216 lb (98 kg)   LMP 04/06/2016 (Approximate)   Breastfeeding? Unknown   BMI 40.81 kg/m  Body  mass index is 40.81 kg/m. Fundal height: not applicable FHTs: 140s  Constitutional: Well nourished, well developed female in no acute distress.  Neck:  Supple, normal appearance, and no thyromegaly  Cardiovascular: S1, S2 normal, no murmur, rub or gallop, regular rate and rhythm Respiratory:  Clear to auscultation bilateral. Normal respiratory effort Abdomen: positive bowel sounds and no masses, hernias; diffusely non tender to palpation, non distended Breasts: breasts appear normal, no suspicious masses, no skin or nipple changes or axillary nodes. Neuro/Psych:  Normal mood and affect.  Skin:  Warm and dry.  Lymphatic:  No inguinal lymphadenopathy.   Pelvic exam: is limited by body habitus EGBUS: within normal limits, Vagina: within normal limits and with no blood in the vault, Cervix: normal appearing cervix without discharge or lesions, closed/long/high, Uterus:  enlarged, c/w 16 week size, and Adnexa:  not evaluated  Assessment: Lisa Richard is a 31 y.o. [id number] 10658w5d based on Patient's last menstrual period was 04/06/2016 (approximate). with an Estimated Date of Delivery: 01/11/17,  for prenatal care.  Plan:  1. History of 3 cesarean sections Needs another at 39 weeks  2. High risk pregnancy, antepartum Monitor for Gest HTN and Obesity risk factors  3. [redacted] weeks gestation of pregnancy Labs, PAP, U C&S  4. Late prenatal care affecting pregnancy in second trimester  5. Obesity, BMI 40 APT and growth US and baby ASA discussed.  The patient has not traveled to a [place] endemic area within the past 6 months, nor has she had unprotected sex with a partner who has travelled to a [place] endemic region within the past 6 months. The patient has been advised to notify us if these factors change any time during this current pregnancy, so adequate testing and monitoring can be initiated.  Problem list reviewed and updated.  Follow up in 1 weeks. Annamarie MajorPaul Cashius Grandstaff, MD, Merlinda FrederickFACOG [facility],  [facility] 08/08/2016  4:20 PM

## 2016-08-08 NOTE — Patient Instructions (Signed)

## 2016-08-08 NOTE — Addendum Note (Signed)
Addended by: Cornelius MorasPATTERSON, Kedron Uno D on: 08/08/2016 04:59 PM   Modules accepted: Orders

## 2016-08-12 LAB — IGP, CTNG, RFX APTIMA HPV ASCU
CHLAMYDIA, NUC. ACID AMP: NEGATIVE
Gonococcus by Nucleic Acid Amp: NEGATIVE
PAP Smear Comment: 0

## 2016-08-12 LAB — URINE CULTURE

## 2016-08-15 ENCOUNTER — Other Ambulatory Visit: Payer: Self-pay | Admitting: Obstetrics & Gynecology

## 2016-08-15 ENCOUNTER — Other Ambulatory Visit (INDEPENDENT_AMBULATORY_CARE_PROVIDER_SITE_OTHER): Payer: Medicaid Other

## 2016-08-15 ENCOUNTER — Other Ambulatory Visit: Payer: Medicaid Other

## 2016-08-15 ENCOUNTER — Ambulatory Visit (INDEPENDENT_AMBULATORY_CARE_PROVIDER_SITE_OTHER): Payer: Medicaid Other | Admitting: Certified Nurse Midwife

## 2016-08-15 ENCOUNTER — Ambulatory Visit: Payer: Medicaid Other

## 2016-08-15 VITALS — BP 140/80 | Wt 213.0 lb

## 2016-08-15 DIAGNOSIS — O99210 Obesity complicating pregnancy, unspecified trimester: Secondary | ICD-10-CM

## 2016-08-15 DIAGNOSIS — Z3A17 17 weeks gestation of pregnancy: Secondary | ICD-10-CM

## 2016-08-15 DIAGNOSIS — O0932 Supervision of pregnancy with insufficient antenatal care, second trimester: Secondary | ICD-10-CM

## 2016-08-15 DIAGNOSIS — O0990 Supervision of high risk pregnancy, unspecified, unspecified trimester: Secondary | ICD-10-CM

## 2016-08-15 DIAGNOSIS — Z98891 History of uterine scar from previous surgery: Secondary | ICD-10-CM

## 2016-08-15 DIAGNOSIS — Z8759 Personal history of other complications of pregnancy, childbirth and the puerperium: Secondary | ICD-10-CM

## 2016-08-15 DIAGNOSIS — Z6841 Body Mass Index (BMI) 40.0 and over, adult: Secondary | ICD-10-CM

## 2016-08-15 DIAGNOSIS — O0992 Supervision of high risk pregnancy, unspecified, second trimester: Secondary | ICD-10-CM

## 2016-08-15 NOTE — Addendum Note (Signed)
Addended by: Kathlene CoteSTANLEY, Shriyans Kuenzi G on: 08/15/2016 08:41 AM   Modules accepted: Orders

## 2016-08-15 NOTE — Progress Notes (Signed)
Anatomy scan today incomplete. EGA [redacted]wk5d. EDC changed to reflect ultrasound because of unsure LMP. Need heart and face views. Feeling some flutters. Given RX for [facility]. Had prenatal panel nd 1 hour GTT done today. FU anatomy and ROB in 4 weeks. Lisa Richard, CNM

## 2016-08-16 LAB — GLUCOSE TOLERANCE, 1 HOUR: Glucose, 1Hr PP: 106 mg/dL (ref 65–199)

## 2016-08-17 LAB — RPR+RH+ABO+RUB AB+AB SCR+CB...
HEMATOCRIT: 30.6 % — AB (ref 34.0–46.6)
HEMOGLOBIN: 9.6 g/dL — AB (ref 11.1–15.9)
HEP B S AG: NEGATIVE
HIV SCREEN 4TH GENERATION: NONREACTIVE
MCH: 22.5 pg — ABNORMAL LOW (ref 26.6–33.0)
MCHC: 31.4 g/dL — ABNORMAL LOW (ref 31.5–35.7)
MCV: 72 fL — ABNORMAL LOW (ref 79–97)
Platelets: 305 10*3/uL (ref 150–379)
RBC: 4.26 x10E6/uL (ref 3.77–5.28)
RDW: 17.5 % — ABNORMAL HIGH (ref 12.3–15.4)
RH TYPE: POSITIVE
RPR: NONREACTIVE
Rubella Antibodies, IGG: 1.82 index (ref >0.99)
VARICELLA: 638 {index} (ref >165)
WBC: 6.4 10*3/uL (ref 3.4–10.8)

## 2016-08-17 LAB — HEMOGLOBINOPATHY EVALUATION
HEMOGLOBIN A2 QUANTITATION: 1.9 % (ref 1.8–3.2)
HEMOGLOBIN F QUANTITATION: 0 % (ref 0.0–2.0)
HGB C: 0 %
HGB S: 0 %
HGB VARIANT: 0 %
Hgb A: 98.1 % (ref 96.4–98.8)

## 2016-08-17 LAB — AB SCR+ANTIBODY ID: ANTIBODY SCREEN: POSITIVE — AB

## 2016-09-11 ENCOUNTER — Other Ambulatory Visit: Payer: Self-pay | Admitting: Advanced Practice Midwife

## 2016-09-11 DIAGNOSIS — Z3492 Encounter for supervision of normal pregnancy, unspecified, second trimester: Secondary | ICD-10-CM

## 2016-09-13 ENCOUNTER — Ambulatory Visit (INDEPENDENT_AMBULATORY_CARE_PROVIDER_SITE_OTHER): Payer: Self-pay | Admitting: Advanced Practice Midwife

## 2016-09-13 ENCOUNTER — Ambulatory Visit (INDEPENDENT_AMBULATORY_CARE_PROVIDER_SITE_OTHER): Payer: Self-pay

## 2016-09-13 VITALS — BP 132/76 | Wt 217.0 lb

## 2016-09-13 DIAGNOSIS — Z3A21 21 weeks gestation of pregnancy: Secondary | ICD-10-CM

## 2016-09-13 DIAGNOSIS — Z3492 Encounter for supervision of normal pregnancy, unspecified, second trimester: Secondary | ICD-10-CM

## 2016-09-13 NOTE — Progress Notes (Addendum)
Follow up anatomy scan today is complete for face, RVOT and LVOT. Sub optimal for 4CH. F/U with 3rd trimester scan for growth and sub optimal anatomy per MD Harris recommendation. Doing well today. No complaints or questions.

## 2016-09-13 NOTE — Progress Notes (Signed)
Repeat anatomy scan today. 

## 2016-10-11 ENCOUNTER — Ambulatory Visit (INDEPENDENT_AMBULATORY_CARE_PROVIDER_SITE_OTHER): Payer: Self-pay | Admitting: Obstetrics and Gynecology

## 2016-10-11 ENCOUNTER — Ambulatory Visit (INDEPENDENT_AMBULATORY_CARE_PROVIDER_SITE_OTHER): Payer: Self-pay | Admitting: Certified Nurse Midwife

## 2016-10-11 VITALS — BP 140/78 | Wt 219.0 lb

## 2016-10-11 VITALS — BP 140/90

## 2016-10-11 DIAGNOSIS — O0990 Supervision of high risk pregnancy, unspecified, unspecified trimester: Secondary | ICD-10-CM

## 2016-10-11 DIAGNOSIS — O09299 Supervision of pregnancy with other poor reproductive or obstetric history, unspecified trimester: Secondary | ICD-10-CM

## 2016-10-11 DIAGNOSIS — Z8759 Personal history of other complications of pregnancy, childbirth and the puerperium: Secondary | ICD-10-CM

## 2016-10-11 DIAGNOSIS — Z3A25 25 weeks gestation of pregnancy: Secondary | ICD-10-CM

## 2016-10-11 DIAGNOSIS — O361990 Maternal care for other isoimmunization, unspecified trimester, not applicable or unspecified: Secondary | ICD-10-CM | POA: Insufficient documentation

## 2016-10-11 DIAGNOSIS — O162 Unspecified maternal hypertension, second trimester: Secondary | ICD-10-CM

## 2016-10-11 DIAGNOSIS — Z538 Procedure and treatment not carried out for other reasons: Secondary | ICD-10-CM

## 2016-10-11 DIAGNOSIS — O99012 Anemia complicating pregnancy, second trimester: Secondary | ICD-10-CM

## 2016-10-11 DIAGNOSIS — O361920 Maternal care for other isoimmunization, second trimester, not applicable or unspecified: Secondary | ICD-10-CM

## 2016-10-11 NOTE — Progress Notes (Signed)
Elevated blood pressure today in mild range. HX of gestational hypertension at term with last pregnancy No headaches, but has noticed some dark spots in vision on occasion. No CP, SOB, RUQ pain. Review of record reveals a positive antibody screen with NOB labs (could not identify, but may make crossmatching blood difficult) and anemia (hmg 9.6 gm/dl) [facility] regarding positive antibody screen and for ultrasound for suboptimal 4 ch heart and growth scan CMP, CBC and PC ratio today. Return to office on 21 or 22 May for blood pressure check.  Addendum 5/18: PIH labs WNL, but hemoglobin 8.5gm/dl. Will refer to hematology first. Called patient: she is not taking PNV. RX for [employer] sent to pharmacy Hold DP consult till after seen by hematologist. [facility] are problems crossmatching blood for surgery. Had blood transfusion surrounding CS last year for anemia preoperatively

## 2016-10-12 ENCOUNTER — Telehealth: Payer: Self-pay | Admitting: Certified Nurse Midwife

## 2016-10-12 ENCOUNTER — Other Ambulatory Visit: Payer: Self-pay | Admitting: Certified Nurse Midwife

## 2016-10-12 DIAGNOSIS — O99012 Anemia complicating pregnancy, second trimester: Secondary | ICD-10-CM

## 2016-10-12 DIAGNOSIS — O99013 Anemia complicating pregnancy, third trimester: Secondary | ICD-10-CM | POA: Insufficient documentation

## 2016-10-12 DIAGNOSIS — O361920 Maternal care for other isoimmunization, second trimester, not applicable or unspecified: Secondary | ICD-10-CM

## 2016-10-12 DIAGNOSIS — O09299 Supervision of pregnancy with other poor reproductive or obstetric history, unspecified trimester: Secondary | ICD-10-CM | POA: Insufficient documentation

## 2016-10-12 LAB — CBC WITH DIFFERENTIAL/PLATELET
BASOS: 0 %
Basophils Absolute: 0 10*3/uL (ref 0.0–0.2)
EOS (ABSOLUTE): 0.1 10*3/uL (ref 0.0–0.4)
EOS: 1 %
HEMATOCRIT: 27.9 % — AB (ref 34.0–46.6)
Hemoglobin: 8.5 g/dL — ABNORMAL LOW (ref 11.1–15.9)
IMMATURE GRANULOCYTES: 0 %
Immature Grans (Abs): 0 10*3/uL (ref 0.0–0.1)
Lymphocytes Absolute: 1.5 10*3/uL (ref 0.7–3.1)
Lymphs: 20 %
MCH: 22.1 pg — AB (ref 26.6–33.0)
MCHC: 30.5 g/dL — ABNORMAL LOW (ref 31.5–35.7)
MCV: 73 fL — AB (ref 79–97)
MONOS ABS: 0.4 10*3/uL (ref 0.1–0.9)
Monocytes: 5 %
NEUTROS ABS: 5.5 10*3/uL (ref 1.4–7.0)
Neutrophils: 74 %
PLATELETS: 247 10*3/uL (ref 150–379)
RBC: 3.84 x10E6/uL (ref 3.77–5.28)
RDW: 16.2 % — AB (ref 12.3–15.4)
WBC: 7.5 10*3/uL (ref 3.4–10.8)

## 2016-10-12 LAB — COMPREHENSIVE METABOLIC PANEL
A/G RATIO: 1 — AB (ref 1.2–2.2)
ALBUMIN: 3.3 g/dL — AB (ref 3.5–5.5)
ALT: 8 IU/L (ref 0–32)
AST: 15 IU/L (ref 0–40)
Alkaline Phosphatase: 57 IU/L (ref 39–117)
BUN / CREAT RATIO: 10 (ref 9–23)
BUN: 5 mg/dL — ABNORMAL LOW (ref 6–20)
Bilirubin Total: 0.4 mg/dL (ref 0.0–1.2)
CO2: 21 mmol/L (ref 18–29)
CREATININE: 0.48 mg/dL — AB (ref 0.57–1.00)
Calcium: 8.8 mg/dL (ref 8.7–10.2)
Chloride: 101 mmol/L (ref 96–106)
GFR, EST AFRICAN AMERICAN: 154 mL/min/{1.73_m2} (ref >59)
GFR, EST NON AFRICAN AMERICAN: 134 mL/min/{1.73_m2} (ref >59)
GLOBULIN, TOTAL: 3.2 g/dL (ref 1.5–4.5)
Glucose: 88 mg/dL (ref 65–99)
Potassium: 3.6 mmol/L (ref 3.5–5.2)
Sodium: 137 mmol/L (ref 134–144)
TOTAL PROTEIN: 6.5 g/dL (ref 6.0–8.5)

## 2016-10-12 LAB — PROTEIN / CREATININE RATIO, URINE
Creatinine, Urine: 167.5 mg/dL
Protein, Ur: 28 mg/dL
Protein/Creat Ratio: 167 mg/g creat (ref 0–200)

## 2016-10-12 MED ORDER — CITRANATAL BLOOM DHA 90-1 & 300 MG PO MISC
1.0000 | Freq: Every day | ORAL | 11 refills | Status: DC
Start: 2016-10-12 — End: 2019-12-18

## 2016-10-12 NOTE — Telephone Encounter (Signed)
Called patient with lab results from yesterday. Chem panel OK, but worsening anemia-not taking vitamins and iron. H&H: 8.5/ 27.9%. Also has positive antibody screen which may make it difficult to T&C her for a blood transfusion. Will refer to hematology.

## 2016-10-15 ENCOUNTER — Encounter: Payer: Self-pay | Admitting: Obstetrics and Gynecology

## 2016-10-15 ENCOUNTER — Ambulatory Visit (INDEPENDENT_AMBULATORY_CARE_PROVIDER_SITE_OTHER): Payer: Self-pay | Admitting: Obstetrics and Gynecology

## 2016-10-15 ENCOUNTER — Ambulatory Visit (INDEPENDENT_AMBULATORY_CARE_PROVIDER_SITE_OTHER): Payer: Medicaid Other | Admitting: Obstetrics and Gynecology

## 2016-10-15 VITALS — BP 122/62 | Wt 221.0 lb

## 2016-10-15 DIAGNOSIS — Z5329 Procedure and treatment not carried out because of patient's decision for other reasons: Secondary | ICD-10-CM

## 2016-10-15 DIAGNOSIS — O162 Unspecified maternal hypertension, second trimester: Secondary | ICD-10-CM

## 2016-10-15 DIAGNOSIS — Z3A26 26 weeks gestation of pregnancy: Secondary | ICD-10-CM

## 2016-10-15 DIAGNOSIS — Z98891 History of uterine scar from previous surgery: Secondary | ICD-10-CM

## 2016-10-15 DIAGNOSIS — Z8759 Personal history of other complications of pregnancy, childbirth and the puerperium: Secondary | ICD-10-CM

## 2016-10-15 DIAGNOSIS — O99012 Anemia complicating pregnancy, second trimester: Secondary | ICD-10-CM

## 2016-10-15 NOTE — Progress Notes (Signed)
Normotensive today, follow up in 2 weeks for 28 week labs

## 2016-10-16 ENCOUNTER — Ambulatory Visit: Payer: Medicaid Other | Admitting: Obstetrics and Gynecology

## 2016-10-16 NOTE — Progress Notes (Signed)
No show

## 2016-10-23 NOTE — Progress Notes (Signed)
Appointment cancelled

## 2016-10-29 NOTE — Progress Notes (Signed)
[facility]  [phone] [phone]) [phone]  ID: Smitty KnudsenLatoshia C Hansman OB: [birth year]  MR#: [id number]  ONG#:[id number]#:[id number]  Patient Care Team: Patient, No Pcp Per as PCP - General (General Practice)  CHIEF COMPLAINT: Anemia affecting pregnancy in the second trimester.  INTERVAL HISTORY: Patient is a 31 year old female who was found to have a significantly decreased hemoglobin and iron stores during pregnancy. She currently feels well and is asymptomatic. She has no neurologic complaints. She is gaining weight appropriately. She denies any recent fevers or illnesses. She denies any chest pain or shortness of breath. She has no nausea, vomiting, constipation, or diarrhea. She has no urinary complaints. Patient feels at her baseline and offers no specific complaints today.  REVIEW OF SYSTEMS:   Review of Systems  Constitutional: Negative.  Negative for fever, malaise/fatigue and weight loss.  Respiratory: Negative.  Negative for cough and shortness of breath.   Cardiovascular: Negative.  Negative for chest pain and leg swelling.  Gastrointestinal: Negative.  Negative for abdominal pain.  Genitourinary: Negative.   Skin: Negative.  Negative for rash.  Neurological: Negative.  Negative for weakness.  Psychiatric/Behavioral: Negative.  The patient is not nervous/anxious.     As per HPI. Otherwise, a complete review of systems is negative.  PAST MEDICAL HISTORY: Past Medical History:  Diagnosis Date  . Obesity     PAST SURGICAL HISTORY: Past Surgical History:  Procedure Laterality Date  . CESAREAN SECTION      x 2  . CESAREAN SECTION N/A 02/20/2016   Procedure: CESAREAN SECTION;  Surgeon: Conard NovakStephen D Jackson, MD;  Location: [facility] ORS;  Service: Obstetrics;  Laterality: N/A;  . DILATION AND CURETTAGE OF UTERUS  2009   Missed AB    FAMILY HISTORY: Family History  Problem Relation Age of Onset  . Prostate cancer Father     ADVANCED DIRECTIVES (Y/N):   N  HEALTH MAINTENANCE: Social History  Substance Use Topics  . Smoking status: Former Games developermoker  . Smokeless tobacco: Never Used  . Alcohol use No     Colonoscopy:  PAP:  Bone density:  Lipid panel:  No Known Allergies  Current Outpatient Prescriptions  Medication Sig Dispense Refill  . Prenat w/o A-FeCbGl-DSS-FA-DHA (CITRANATAL BLOOM DHA) 90-1 & 300 MG MISC Take 1 tablet by mouth daily. (Patient not taking: Reported on 10/30/2016) 30 each 11   No current facility-administered medications for this visit.     OBJECTIVE: Vitals:   10/30/16 1120  BP: 111/68  Pulse: 91  Resp: 14  Temp: 98.3 F (36.8 C)     Body mass index is 41.84 kg/m.    ECOG FS:0 - Asymptomatic  General: Well-developed, well-nourished, no acute distress. Eyes: Pink conjunctiva, anicteric sclera. HEENT: Normocephalic, moist mucous membranes, clear oropharnyx. Lungs: Clear to auscultation bilaterally. Heart: Regular rate and rhythm. No rubs, murmurs, or gallops. Abdomen: Appears appropriate for gestational age. Musculoskeletal: No edema, cyanosis, or clubbing. Neuro: Alert, answering all questions appropriately. Cranial nerves grossly intact. Skin: No rashes or petechiae noted. Psych: Normal affect. Lymphatics: No cervical, calvicular, axillary or inguinal LAD.   LAB RESULTS:  Lab Results  Component Value Date   NA 137 10/11/2016   K 3.6 10/11/2016   CL 101 10/11/2016   CO2 21 10/11/2016   GLUCOSE 88 10/11/2016   BUN 5 (L) 10/11/2016   CREATININE 0.48 (L) 10/11/2016   CALCIUM 8.8 10/11/2016   PROT 6.5 10/11/2016   ALBUMIN 3.3 (L) 10/11/2016   AST 15 10/11/2016   ALT 8  10/11/2016   ALKPHOS 57 10/11/2016   BILITOT 0.4 10/11/2016   GFRNONAA 134 10/11/2016   GFRAA 154 10/11/2016    Lab Results  Component Value Date   WBC 7.8 10/31/2016   NEUTROABS 5.7 10/31/2016   HGB 7.9 (L) 10/31/2016   HCT 27.1 (L) 10/31/2016   MCV 72 (L) 10/31/2016   PLT 219 10/31/2016   Lab Results  Component  Value Date   IRON 28 10/30/2016   TIBC 497 (H) 10/30/2016   IRONPCTSAT 6 (L) 10/30/2016   Lab Results  Component Value Date   FERRITIN 5 (L) 10/30/2016     STUDIES: No results found.  ASSESSMENT: Anemia affecting pregnancy in the second trimester  PLAN:    1. Anemia affecting pregnancy in the second trimester: Patient's hemoglobin and iron stores are significantly reduced. Return to clinic in 1 and 2 weeks receive 510 mg IV Feraheme. Patient will then return to clinic the first week of August prior to her due date for further evaluation and consideration of additional IV iron. 2. Pregnancy: Patient's due date is approximately mid-August. Continue monitoring per OB/GYN. Follow-up as above.  Approximately 45 minutes spent in discussion of which greater than 50% was consultation.  Patient expressed understanding and was in agreement with this plan. She also understands that She can call clinic at any time with any questions, concerns, or complaints.    Jeralyn Ruths, MD   11/04/2016 7:23 PM

## 2016-10-30 ENCOUNTER — Encounter: Payer: Self-pay | Admitting: Oncology

## 2016-10-30 ENCOUNTER — Inpatient Hospital Stay: Payer: Medicaid Other

## 2016-10-30 ENCOUNTER — Inpatient Hospital Stay: Payer: Medicaid Other | Attending: Oncology | Admitting: Oncology

## 2016-10-30 VITALS — BP 111/68 | HR 91 | Temp 98.3°F | Resp 14 | Ht 61.0 in | Wt 221.4 lb

## 2016-10-30 DIAGNOSIS — Z87891 Personal history of nicotine dependence: Secondary | ICD-10-CM | POA: Diagnosis not present

## 2016-10-30 DIAGNOSIS — E669 Obesity, unspecified: Secondary | ICD-10-CM | POA: Diagnosis not present

## 2016-10-30 DIAGNOSIS — O99012 Anemia complicating pregnancy, second trimester: Secondary | ICD-10-CM

## 2016-10-30 LAB — CBC
HCT: 25.7 % — ABNORMAL LOW (ref 35.0–47.0)
Hemoglobin: 8.1 g/dL — ABNORMAL LOW (ref 12.0–16.0)
MCH: 21.8 pg — ABNORMAL LOW (ref 26.0–34.0)
MCHC: 31.6 g/dL — ABNORMAL LOW (ref 32.0–36.0)
MCV: 68.9 fL — AB (ref 80.0–100.0)
PLATELETS: 208 10*3/uL (ref 150–440)
RBC: 3.73 MIL/uL — AB (ref 3.80–5.20)
RDW: 16.3 % — ABNORMAL HIGH (ref 11.5–14.5)
WBC: 8.2 10*3/uL (ref 3.6–11.0)

## 2016-10-30 LAB — IRON AND TIBC
IRON: 28 ug/dL (ref 28–170)
Saturation Ratios: 6 % — ABNORMAL LOW (ref 10.4–31.8)
TIBC: 497 ug/dL — AB (ref 250–450)
UIBC: 469 ug/dL

## 2016-10-30 LAB — FOLATE: FOLATE: 8.2 ng/mL (ref >5.9)

## 2016-10-30 LAB — FERRITIN: Ferritin: 5 ng/mL — ABNORMAL LOW (ref 11–307)

## 2016-10-30 LAB — VITAMIN B12: Vitamin B-12: 173 pg/mL — ABNORMAL LOW (ref 180–914)

## 2016-10-31 ENCOUNTER — Other Ambulatory Visit: Payer: Medicaid Other

## 2016-10-31 ENCOUNTER — Ambulatory Visit (INDEPENDENT_AMBULATORY_CARE_PROVIDER_SITE_OTHER): Payer: Self-pay | Admitting: Advanced Practice Midwife

## 2016-10-31 VITALS — BP 126/74 | Wt 222.0 lb

## 2016-10-31 DIAGNOSIS — O162 Unspecified maternal hypertension, second trimester: Secondary | ICD-10-CM

## 2016-10-31 DIAGNOSIS — Z98891 History of uterine scar from previous surgery: Secondary | ICD-10-CM

## 2016-10-31 DIAGNOSIS — Z3A28 28 weeks gestation of pregnancy: Secondary | ICD-10-CM

## 2016-10-31 DIAGNOSIS — O99810 Abnormal glucose complicating pregnancy: Secondary | ICD-10-CM

## 2016-10-31 DIAGNOSIS — O99012 Anemia complicating pregnancy, second trimester: Secondary | ICD-10-CM

## 2016-10-31 DIAGNOSIS — Z3A26 26 weeks gestation of pregnancy: Secondary | ICD-10-CM

## 2016-10-31 NOTE — Progress Notes (Signed)
Had some cramping yesterday but none since then. Urine is dark- encouraged to increase hydration. No LOF, VB. Good fetal movement. 28 week labs today.

## 2016-10-31 NOTE — Progress Notes (Signed)
28 week labs today.  

## 2016-11-01 ENCOUNTER — Inpatient Hospital Stay: Payer: Medicaid Other

## 2016-11-01 ENCOUNTER — Telehealth: Payer: Self-pay | Admitting: Obstetrics and Gynecology

## 2016-11-01 ENCOUNTER — Encounter: Payer: Self-pay | Admitting: Obstetrics and Gynecology

## 2016-11-01 DIAGNOSIS — O99012 Anemia complicating pregnancy, second trimester: Secondary | ICD-10-CM

## 2016-11-01 DIAGNOSIS — R7309 Other abnormal glucose: Secondary | ICD-10-CM

## 2016-11-01 LAB — 28 WEEK RH+PANEL
BASOS: 0 %
Basophils Absolute: 0 10*3/uL (ref 0.0–0.2)
EOS (ABSOLUTE): 0.1 10*3/uL (ref 0.0–0.4)
EOS: 1 %
GESTATIONAL DIABETES SCREEN: 155 mg/dL — AB (ref 65–139)
HEMOGLOBIN: 7.9 g/dL — AB (ref 11.1–15.9)
HIV Screen 4th Generation wRfx: NONREACTIVE
Hematocrit: 27.1 % — ABNORMAL LOW (ref 34.0–46.6)
IMMATURE GRANS (ABS): 0 10*3/uL (ref 0.0–0.1)
Immature Granulocytes: 0 %
LYMPHS: 19 %
Lymphocytes Absolute: 1.5 10*3/uL (ref 0.7–3.1)
MCH: 21.1 pg — ABNORMAL LOW (ref 26.6–33.0)
MCHC: 29.2 g/dL — ABNORMAL LOW (ref 31.5–35.7)
MCV: 72 fL — ABNORMAL LOW (ref 79–97)
Monocytes Absolute: 0.5 10*3/uL (ref 0.1–0.9)
Monocytes: 6 %
NEUTROS ABS: 5.7 10*3/uL (ref 1.4–7.0)
Neutrophils: 74 %
Platelets: 219 10*3/uL (ref 150–379)
RBC: 3.75 x10E6/uL — AB (ref 3.77–5.28)
RDW: 16.1 % — ABNORMAL HIGH (ref 12.3–15.4)
RPR: NONREACTIVE
WBC: 7.8 10*3/uL (ref 3.4–10.8)

## 2016-11-01 MED ORDER — SODIUM CHLORIDE 0.9 % IV SOLN
510.0000 mg | Freq: Once | INTRAVENOUS | Status: AC
  Administered 2016-11-01: 510 mg via INTRAVENOUS
  Filled 2016-11-01: qty 17

## 2016-11-01 MED ORDER — SODIUM CHLORIDE 0.9 % IV SOLN
Freq: Once | INTRAVENOUS | Status: AC
  Administered 2016-11-01: 14:00:00 via INTRAVENOUS
  Filled 2016-11-01: qty 1000

## 2016-11-01 NOTE — Telephone Encounter (Signed)
-----   Message from Vena AustriaAndreas Staebler, MD sent at 11/01/2016  9:58 AM EDT ----- Regarding: 3-hr glucose Patient needs a 3-hr glucose test orders are in

## 2016-11-01 NOTE — Telephone Encounter (Signed)
Pt is schedule 01/05/17 for lab

## 2016-11-05 ENCOUNTER — Other Ambulatory Visit: Payer: Medicaid Other

## 2016-11-05 DIAGNOSIS — O99810 Abnormal glucose complicating pregnancy: Secondary | ICD-10-CM

## 2016-11-05 DIAGNOSIS — R7309 Other abnormal glucose: Secondary | ICD-10-CM

## 2016-11-05 DIAGNOSIS — O99012 Anemia complicating pregnancy, second trimester: Secondary | ICD-10-CM

## 2016-11-05 DIAGNOSIS — Z98891 History of uterine scar from previous surgery: Secondary | ICD-10-CM

## 2016-11-05 DIAGNOSIS — Z3A26 26 weeks gestation of pregnancy: Secondary | ICD-10-CM

## 2016-11-05 NOTE — Addendum Note (Signed)
Addended by: Kathlene CoteSTANLEY, Kavin Weckwerth G on: 11/05/2016 09:32 AM   Modules accepted: Orders

## 2016-11-06 ENCOUNTER — Inpatient Hospital Stay: Payer: Medicaid Other

## 2016-11-06 ENCOUNTER — Other Ambulatory Visit: Payer: Self-pay | Admitting: Obstetrics and Gynecology

## 2016-11-06 ENCOUNTER — Encounter: Payer: Self-pay | Admitting: Obstetrics and Gynecology

## 2016-11-06 DIAGNOSIS — O24419 Gestational diabetes mellitus in pregnancy, unspecified control: Secondary | ICD-10-CM | POA: Insufficient documentation

## 2016-11-06 DIAGNOSIS — O24410 Gestational diabetes mellitus in pregnancy, diet controlled: Secondary | ICD-10-CM

## 2016-11-06 LAB — GESTATIONAL GLUCOSE TOLERANCE
GLUCOSE 1 HOUR GTT: 163 mg/dL (ref 65–179)
GLUCOSE 2 HOUR GTT: 202 mg/dL — AB (ref 65–154)
GLUCOSE 3 HOUR GTT: 144 mg/dL — AB (ref 65–139)
Glucose, Fasting: 95 mg/dL — ABNORMAL HIGH (ref 65–94)

## 2016-11-06 MED ORDER — ACCU-CHEK FASTCLIX LANCETS MISC: 1.0000 [IU] | Freq: Four times a day (QID) | 12 refills | Status: DC

## 2016-11-06 MED ORDER — GLUCOSE BLOOD VI STRP: ORAL_STRIP | 12 refills | Status: DC

## 2016-11-06 MED ORDER — ACCU-CHEK NANO SMARTVIEW W/DEVICE KIT: 1.0000 | PACK | 0 refills | Status: DC

## 2016-11-07 ENCOUNTER — Inpatient Hospital Stay: Payer: Medicaid Other

## 2016-11-07 ENCOUNTER — Telehealth: Payer: Self-pay | Admitting: Obstetrics and Gynecology

## 2016-11-07 VITALS — BP 120/76 | HR 86 | Temp 98.6°F | Resp 18

## 2016-11-07 DIAGNOSIS — O99012 Anemia complicating pregnancy, second trimester: Secondary | ICD-10-CM

## 2016-11-07 MED ORDER — SODIUM CHLORIDE 0.9 % IV SOLN
510.0000 mg | Freq: Once | INTRAVENOUS | Status: AC
  Administered 2016-11-07: 510 mg via INTRAVENOUS
  Filled 2016-11-07: qty 17

## 2016-11-07 MED ORDER — SODIUM CHLORIDE 0.9 % IV SOLN
Freq: Once | INTRAVENOUS | Status: AC
  Administered 2016-11-07: 14:00:00 via INTRAVENOUS
  Filled 2016-11-07: qty 1000

## 2016-11-07 NOTE — Telephone Encounter (Signed)
Pt came into the office today stating that medicaid will not cover her meter that she needs. Any advice?

## 2016-11-08 ENCOUNTER — Other Ambulatory Visit: Payer: Self-pay | Admitting: Obstetrics and Gynecology

## 2016-11-08 MED ORDER — ONETOUCH ULTRA 2 W/DEVICE KIT: 1.0000 | PACK | 8 refills | Status: DC

## 2016-11-08 MED ORDER — GLUCOSE BLOOD VI STRP: ORAL_STRIP | 5 refills | Status: DC

## 2016-11-08 MED ORDER — ONETOUCH ULTRASOFT LANCETS MISC: 12 refills | Status: DC

## 2016-11-08 NOTE — Telephone Encounter (Signed)
I spoke to the [facility]ist [facility] [place], She stated the One Touch Ultra machine[place]lancets/strips [place]. The Rx has been changed as a call in not through [employer].   Pt aware and knows she can go ahead and pick this up from the pharmacy.   Message sent to AMS to switch Rx and all necessary supplies in [employer].

## 2016-11-13 ENCOUNTER — Encounter: Payer: Medicaid Other | Attending: Obstetrics and Gynecology | Admitting: Dietician

## 2016-11-13 ENCOUNTER — Inpatient Hospital Stay: Payer: Medicaid Other

## 2016-11-13 ENCOUNTER — Encounter: Payer: Self-pay | Admitting: Dietician

## 2016-11-13 VITALS — BP 142/76 | Ht 61.0 in | Wt 220.9 lb

## 2016-11-13 DIAGNOSIS — Z3A00 Weeks of gestation of pregnancy not specified: Secondary | ICD-10-CM | POA: Insufficient documentation

## 2016-11-13 DIAGNOSIS — O24410 Gestational diabetes mellitus in pregnancy, diet controlled: Secondary | ICD-10-CM | POA: Diagnosis present

## 2016-11-13 NOTE — Patient Instructions (Addendum)
   Read booklet on Gestational Diabetes Follow Gestational Meal Planning Guidelines Complete a 3 Day Food Record and bring to next appointment Check blood sugars 4 x day - before breakfast and 2 hrs after every meal and record  Bring blood sugar log to all appointments Walk 20-30 minutes at least 5 x week if permitted by MD Next appointment: 11-22-16

## 2016-11-13 NOTE — Progress Notes (Signed)
Appt. Start Time: 0900 Appt. End Time:1030  GDM Class 1 Diabetes Overview - define DM; state own type of DM; identify functions of pancreas and insulin; define insulin deficiency vs insulin resistance  Psychosocial - identify DM as a source of stress; state the effects of stress on BG control; verbalize appropriate stress management techniques; identify personal stress issues   Nutritional Management - describe effects of food on blood glucose; identify sources of carbohydrate, protein and fat; verbalize the importance of balance meals in controlling blood glucose; identify meals as well balanced or not; estimate servings of carbohydrate from menus; use food labels to identify servings size, content of carbohydrate, fiber, protein, fat, saturated fat and sodium; recognize food sources of fat, saturated fat, trans fat, sodium and verbalize goals for intake; describe healthful appropriate food choices when dining out   Exercise - describe the effects of exercise on blood glucose and importance of regular exercise in controlling diabetes; state a plan for personal exercise; verbalize contraindications for exercise  Medications - state name, dose, timing of currently prescribed medications; describe types of medications available for diabetes  Insulin Training - prepare and administer insulin accurately; state correct rotation pattern; verbalize safe and lawful needle disposal  Self-Monitoring - state importance of HBGM and demo procedure accurately; use HBGM results to effectively manage diabetes; identify importance of regular HbA1C testing and goals for results  Acute Complications/Sick Day Guidelines - recognize hyperglycemia and hypoglycemia with causes and effects; identify blood glucose results as high, low or in control; list steps in treating and preventing high and low blood glucose; state appropriate measure to manage blood glucose when ill (need for meds, HBGM plan, when to call physician,  need for fluids)  Chronic Complications/Foot, Skin, Eye Dental Care - identify possible long-term complications of diabetes (retinopathy, neuropathy, nephropathy, cardiovascular disease, infections); explain steps in prevention and treatment of chronic complications; state importance of daily self-foot exams; describe how to examine feet and what to look for; explain appropriate eye and dental care  Lifestyle Changes/Goals & Health/Community Resources - state benefits of making appropriate lifestyle changes; identify habits that need to change (meals, tobacco, alcohol); identify strategies to reduce risk factors for personal health; set goals for proper diabetes care; state need for and frequency of healthcare follow-up; describe appropriate community resources for good health (ADA, web sites, apps)   Pregnancy/Sexual Health - define gestational diabetes; state importance of good blood glucose control and birth control prior to pregnancy; state importance of good blood glucose control in preventing sexual problems (impotence, vaginal dryness, infections, loss of desire); state relationship of blood glucose control and pregnancy outcome; describe risk of maternal and fetal complications  Teaching Materials Used:  General Meal Planning Guidelines Daily Food Record Gestational Diabetes Booklet Gestational Video Goals for Healthy Pregnancy

## 2016-11-14 ENCOUNTER — Encounter: Payer: Medicaid Other | Admitting: Obstetrics and Gynecology

## 2016-11-14 ENCOUNTER — Ambulatory Visit (INDEPENDENT_AMBULATORY_CARE_PROVIDER_SITE_OTHER): Payer: Medicaid Other | Admitting: Obstetrics & Gynecology

## 2016-11-14 VITALS — BP 120/70 | HR 88 | Wt 225.0 lb

## 2016-11-14 DIAGNOSIS — Z6841 Body Mass Index (BMI) 40.0 and over, adult: Secondary | ICD-10-CM

## 2016-11-14 DIAGNOSIS — Z8759 Personal history of other complications of pregnancy, childbirth and the puerperium: Secondary | ICD-10-CM

## 2016-11-14 DIAGNOSIS — O0990 Supervision of high risk pregnancy, unspecified, unspecified trimester: Secondary | ICD-10-CM

## 2016-11-14 DIAGNOSIS — Z3A30 30 weeks gestation of pregnancy: Secondary | ICD-10-CM

## 2016-11-14 DIAGNOSIS — O99210 Obesity complicating pregnancy, unspecified trimester: Secondary | ICD-10-CM

## 2016-11-14 DIAGNOSIS — O24410 Gestational diabetes mellitus in pregnancy, diet controlled: Secondary | ICD-10-CM

## 2016-11-14 DIAGNOSIS — Z23 Encounter for immunization: Secondary | ICD-10-CM

## 2016-11-14 DIAGNOSIS — Z98891 History of uterine scar from previous surgery: Secondary | ICD-10-CM

## 2016-11-14 NOTE — Progress Notes (Signed)
1. GDMA1.  Cont home BS monitoring,  Mostly normal. APT 36 weeks. 2. Obesity RF disucssed       Growth US nv 3. CS history, sch for Aug 21 4. PNV, [facility].

## 2016-11-14 NOTE — Addendum Note (Signed)
Addended by: Cornelius MorasPATTERSON, Arvin Abello D on: 11/14/2016 05:07 PM   Modules accepted: Orders

## 2016-11-14 NOTE — Patient Instructions (Signed)
Third Trimester of Pregnancy The third trimester is from week 28 through week 40 (months 7 through 9). The third trimester is a time when the unborn baby (fetus) is growing rapidly. At the end of the ninth month, the fetus is about 20 inches in length and weighs 6-10 pounds. Body changes during your third trimester Your body will continue to go through many changes during pregnancy. The changes vary from woman to woman. During the third trimester:  Your weight will continue to increase. You can expect to gain 25-35 pounds (11-16 kg) by the end of the pregnancy.  You may begin to get stretch marks on your hips, abdomen, and breasts.  You may urinate more often because the fetus is moving lower into your pelvis and pressing on your bladder.  You may develop or continue to have heartburn. This is caused by increased hormones that slow down muscles in the digestive tract.  You may develop or continue to have constipation because increased hormones slow digestion and cause the muscles that push waste through your intestines to relax.  You may develop hemorrhoids. These are swollen veins (varicose veins) in the rectum that can itch or be painful.  You may develop swollen, bulging veins (varicose veins) in your legs.  You may have increased body aches in the pelvis, back, or thighs. This is due to weight gain and increased hormones that are relaxing your joints.  You may have changes in your hair. These can include thickening of your hair, rapid growth, and changes in texture. Some women also have hair loss during or after pregnancy, or hair that feels dry or thin. Your hair will most likely return to normal after your baby is born.  Your breasts will continue to grow and they will continue to become tender. A yellow fluid (colostrum) may leak from your breasts. This is the first milk you are producing for your baby.  Your belly button may stick out.  You may notice more swelling in your hands,  face, or ankles.  You may have increased tingling or numbness in your hands, arms, and legs. The skin on your belly may also feel numb.  You may feel short of breath because of your expanding uterus.  You may have more problems sleeping. This can be caused by the size of your belly, increased need to urinate, and an increase in your body's metabolism.  You may notice the fetus "dropping," or moving lower in your abdomen (lightening).  You may have increased vaginal discharge.  You may notice your joints feel loose and you may have pain around your pelvic bone.  What to expect at prenatal visits You will have prenatal exams every 2 weeks until week 36. Then you will have weekly prenatal exams. During a routine prenatal visit:  You will be weighed to make sure you and the baby are growing normally.  Your blood pressure will be taken.  Your abdomen will be measured to track your baby's growth.  The fetal heartbeat will be listened to.  Any test results from the previous visit will be discussed.  You may have a cervical check near your due date to see if your cervix has softened or thinned (effaced).  You will be tested for Group B streptococcus. This happens between 35 and 37 weeks.  Your health care provider may ask you:  What your birth plan is.  How you are feeling.  If you are feeling the baby move.  If you have had   any abnormal symptoms, such as leaking fluid, bleeding, severe headaches, or abdominal cramping.  If you are using any tobacco products, including cigarettes, chewing tobacco, and electronic cigarettes.  If you have any questions.  Other tests or screenings that may be performed during your third trimester include:  Blood tests that check for low iron levels (anemia).  Fetal testing to check the health, activity level, and growth of the fetus. Testing is done if you have certain medical conditions or if there are problems during the  pregnancy.  Nonstress test (NST). This test checks the health of your baby to make sure there are no signs of problems, such as the baby not getting enough oxygen. During this test, a belt is placed around your belly. The baby is made to move, and its heart rate is monitored during movement.  What is false labor? False labor is a condition in which you feel small, irregular tightenings of the muscles in the womb (contractions) that usually go away with rest, changing position, or drinking water. These are called Braxton Hicks contractions. Contractions may last for hours, days, or even weeks before true labor sets in. If contractions come at regular intervals, become more frequent, increase in intensity, or become painful, you should see your health care provider. What are the signs of labor?  Abdominal cramps.  Regular contractions that start at 10 minutes apart and become stronger and more frequent with time.  Contractions that start on the top of the uterus and spread down to the lower abdomen and back.  Increased pelvic pressure and dull back pain.  A watery or bloody mucus discharge that comes from the vagina.  Leaking of amniotic fluid. This is also known as your "water breaking." It could be a slow trickle or a gush. Let your health care provider know if it has a color or strange odor. If you have any of these signs, call your health care provider right away, even if it is before your due date. Follow these instructions at home: Medicines  Follow your health care provider's instructions regarding medicine use. Specific medicines may be either safe or unsafe to take during pregnancy.  Take a prenatal vitamin that contains at least 600 micrograms (mcg) of folic acid.  If you develop constipation, try taking a stool softener if your health care provider approves. Eating and drinking  Eat a balanced diet that includes fresh fruits and vegetables, whole grains, good sources of protein  such as meat, eggs, or tofu, and low-fat dairy. Your health care provider will help you determine the amount of weight gain that is right for you.  Avoid raw meat and uncooked cheese. These carry germs that can cause birth defects in the baby.  If you have low calcium intake from food, talk to your health care provider about whether you should take a daily calcium supplement.  Eat four or five small meals rather than three large meals a day.  Limit foods that are high in fat and processed sugars, such as fried and sweet foods.  To prevent constipation: ? Drink enough fluid to keep your urine clear or pale yellow. ? Eat foods that are high in fiber, such as fresh fruits and vegetables, whole grains, and beans. Activity  Exercise only as directed by your health care provider. Most women can continue their usual exercise routine during pregnancy. Try to exercise for 30 minutes at least 5 days a week. Stop exercising if you experience uterine contractions.  Avoid heavy   lifting.  Do not exercise in extreme heat or humidity, or at high altitudes.  Wear low-heel, comfortable shoes.  Practice good posture.  You may continue to have sex unless your health care provider tells you otherwise. Relieving pain and discomfort  Take frequent breaks and rest with your legs elevated if you have leg cramps or low back pain.  Take warm sitz baths to soothe any pain or discomfort caused by hemorrhoids. Use hemorrhoid cream if your health care provider approves.  Wear a good support bra to prevent discomfort from breast tenderness.  If you develop varicose veins: ? Wear support pantyhose or compression stockings as told by your healthcare provider. ? Elevate your feet for 15 minutes, 3-4 times a day. Prenatal care  Write down your questions. Take them to your prenatal visits.  Keep all your prenatal visits as told by your health care provider. This is important. Safety  Wear your seat belt at  all times when driving.  Make a list of emergency phone numbers, including numbers for family, friends, the hospital, and police and fire departments. General instructions  Avoid cat litter boxes and soil used by cats. These carry germs that can cause birth defects in the baby. If you have a cat, ask someone to clean the litter box for you.  Do not travel far distances unless it is absolutely necessary and only with the approval of your health care provider.  Do not use hot tubs, steam rooms, or saunas.  Do not drink alcohol.  Do not use any products that contain nicotine or tobacco, such as cigarettes and e-cigarettes. If you need help quitting, ask your health care provider.  Do not use any medicinal herbs or unprescribed drugs. These chemicals affect the formation and growth of the baby.  Do not douche or use tampons or scented sanitary pads.  Do not cross your legs for long periods of time.  To prepare for the arrival of your baby: ? Take prenatal classes to understand, practice, and ask questions about labor and delivery. ? Make a trial run to the hospital. ? Visit the hospital and tour the maternity area. ? Arrange for maternity or paternity leave through employers. ? Arrange for family and friends to take care of pets while you are in the hospital. ? Purchase a rear-facing car seat and make sure you know how to install it in your car. ? Pack your hospital bag. ? Prepare the baby's nursery. Make sure to remove all pillows and stuffed animals from the baby's crib to prevent suffocation.  Visit your dentist if you have not gone during your pregnancy. Use a soft toothbrush to brush your teeth and be gentle when you floss. Contact a health care provider if:  You are unsure if you are in labor or if your water has broken.  You become dizzy.  You have mild pelvic cramps, pelvic pressure, or nagging pain in your abdominal area.  You have lower back pain.  You have persistent  nausea, vomiting, or diarrhea.  You have an unusual or bad smelling vaginal discharge.  You have pain when you urinate. Get help right away if:  Your water breaks before 37 weeks.  You have regular contractions less than 5 minutes apart before 37 weeks.  You have a fever.  You are leaking fluid from your vagina.  You have spotting or bleeding from your vagina.  You have severe abdominal pain or cramping.  You have rapid weight loss or weight gain.    You have shortness of breath with chest pain.  You notice sudden or extreme swelling of your face, hands, ankles, feet, or legs.  Your baby makes fewer than 10 movements in 2 hours.  You have severe headaches that do not go away when you take medicine.  You have vision changes. Summary  The third trimester is from week 28 through week 40, months 7 through 9. The third trimester is a time when the unborn baby (fetus) is growing rapidly.  During the third trimester, your discomfort may increase as you and your baby continue to gain weight. You may have abdominal, leg, and back pain, sleeping problems, and an increased need to urinate.  During the third trimester your breasts will keep growing and they will continue to become tender. A yellow fluid (colostrum) may leak from your breasts. This is the first milk you are producing for your baby.  False labor is a condition in which you feel small, irregular tightenings of the muscles in the womb (contractions) that eventually go away. These are called Braxton Hicks contractions. Contractions may last for hours, days, or even weeks before true labor sets in.  Signs of labor can include: abdominal cramps; regular contractions that start at 10 minutes apart and become stronger and more frequent with time; watery or bloody mucus discharge that comes from the vagina; increased pelvic pressure and dull back pain; and leaking of amniotic fluid. This information is not intended to replace advice  given to you by your health care provider. Make sure you discuss any questions you have with your health care provider. Document Released: 05/08/2001 Document Revised: 10/20/2015 Document Reviewed: 07/15/2012 Elsevier Interactive Patient Education  2017 [employer]  

## 2016-11-15 ENCOUNTER — Telehealth: Payer: Self-pay | Admitting: Obstetrics & Gynecology

## 2016-11-15 NOTE — Telephone Encounter (Signed)
-----   Message from Nadara Mustardobert P Harris, MD sent at 11/14/2016  5:01 PM EDT ----- Regarding: Surgery Surgery Booking Request Patient Full Name:   MRN: [id number]  DOB: [birth year]  Surgeon: Letitia Libraobert Paul Harris, MD  Requested Surgery Date and Time: 01/15/17 Primary Diagnosis AND Code: Repeat CS (prior history x3) Secondary Diagnosis and Code: Gestational Diabetes Surgical Procedure: Cesarean Section L&D Notification: Yes Admission Status: surgery admit Length of Surgery: 1 hour Special Case Needs: no H&P: yes (date) Phone Interview???: no Interpreter: Language:  Medical Clearance: no Special Scheduling Instructions: no

## 2016-11-15 NOTE — Telephone Encounter (Signed)
Patient is aware of H&P on 01/14/17 @ 8:20am with a Pre-admit Testing appointment afterwards, and OR on 01/15/17.

## 2016-11-19 ENCOUNTER — Telehealth: Payer: Self-pay | Admitting: Dietician

## 2016-11-19 NOTE — Telephone Encounter (Signed)
Called patient to reschedule appointment for 11/22/16, which she has cancelled due to work schedule. Left message with later available times and requested a call back.

## 2016-11-20 ENCOUNTER — Encounter: Payer: Medicaid Other | Admitting: Obstetrics & Gynecology

## 2016-11-22 ENCOUNTER — Ambulatory Visit (INDEPENDENT_AMBULATORY_CARE_PROVIDER_SITE_OTHER): Payer: Medicaid Other | Admitting: Obstetrics and Gynecology

## 2016-11-22 ENCOUNTER — Ambulatory Visit: Payer: Medicaid Other | Admitting: Dietician

## 2016-11-22 VITALS — BP 122/80 | Wt 220.0 lb

## 2016-11-22 DIAGNOSIS — B9689 Other specified bacterial agents as the cause of diseases classified elsewhere: Secondary | ICD-10-CM

## 2016-11-22 DIAGNOSIS — N760 Acute vaginitis: Secondary | ICD-10-CM

## 2016-11-22 DIAGNOSIS — O0990 Supervision of high risk pregnancy, unspecified, unspecified trimester: Secondary | ICD-10-CM

## 2016-11-22 DIAGNOSIS — Z3A31 31 weeks gestation of pregnancy: Secondary | ICD-10-CM

## 2016-11-22 LAB — POCT WET PREP WITH KOH
CLUE CELLS WET PREP PER HPF POC: POSITIVE
KOH Prep POC: POSITIVE — AB
Trichomonas, UA: NEGATIVE
Yeast Wet Prep HPF POC: NEGATIVE

## 2016-11-22 MED ORDER — METRONIDAZOLE 500 MG PO TABS: ORAL_TABLET | ORAL | 0 refills | Status: DC

## 2016-11-22 NOTE — Progress Notes (Signed)
Stopped PNVs due to ins. Try OTC PNVs. No VB, LOF but has increased white d/c with odor, no irritation. Hx of BV in past. BV on wet prep today. Rx flagyl. Has growth u/s and ROB next wk.  BS monitoring mostly normal.

## 2016-11-22 NOTE — Progress Notes (Signed)
Results for orders placed or performed in visit on 11/22/16 (from the past 24 hour(s))  POCT Wet Prep with KOH     Status: Abnormal   Collection Time: 11/22/16  9:04 AM  Result Value Ref Range   Trichomonas, UA Negative    Clue Cells Wet Prep HPF POC pos    Epithelial Wet Prep HPF POC  Few, Moderate, Many, Too numerous to count   Yeast Wet Prep HPF POC neg    Bacteria Wet Prep HPF POC  Few   RBC Wet Prep HPF POC     WBC Wet Prep HPF POC     KOH Prep POC Positive (A) Negative    [redacted] weeks gestation of pregnancy  High risk pregnancy, antepartum  Bacterial vaginosis - Plan: metroNIDAZOLE (FLAGYL) 500 MG tablet, POCT Wet Prep with KOH

## 2016-11-26 ENCOUNTER — Ambulatory Visit: Payer: Medicaid Other | Admitting: Dietician

## 2016-11-27 ENCOUNTER — Encounter: Payer: Self-pay | Admitting: Dietician

## 2016-11-27 NOTE — Progress Notes (Signed)
Patient did not keep her appointment on 11/26/16, which had been rescheduled from 11/22/16. Sent discharge letter to MD.

## 2016-11-29 ENCOUNTER — Other Ambulatory Visit (INDEPENDENT_AMBULATORY_CARE_PROVIDER_SITE_OTHER): Payer: Medicaid Other

## 2016-11-29 ENCOUNTER — Ambulatory Visit (INDEPENDENT_AMBULATORY_CARE_PROVIDER_SITE_OTHER): Payer: Medicaid Other | Admitting: Certified Nurse Midwife

## 2016-11-29 VITALS — BP 124/76 | Wt 217.0 lb

## 2016-11-29 DIAGNOSIS — O0990 Supervision of high risk pregnancy, unspecified, unspecified trimester: Secondary | ICD-10-CM

## 2016-11-29 DIAGNOSIS — Z362 Encounter for other antenatal screening follow-up: Secondary | ICD-10-CM

## 2016-11-29 DIAGNOSIS — O24410 Gestational diabetes mellitus in pregnancy, diet controlled: Secondary | ICD-10-CM

## 2016-11-29 DIAGNOSIS — O99013 Anemia complicating pregnancy, third trimester: Secondary | ICD-10-CM

## 2016-11-29 DIAGNOSIS — Z3A32 32 weeks gestation of pregnancy: Secondary | ICD-10-CM

## 2016-11-29 DIAGNOSIS — O99210 Obesity complicating pregnancy, unspecified trimester: Secondary | ICD-10-CM | POA: Diagnosis not present

## 2016-11-29 DIAGNOSIS — Z6841 Body Mass Index (BMI) 40.0 and over, adult: Secondary | ICD-10-CM

## 2016-11-29 DIAGNOSIS — O361930 Maternal care for other isoimmunization, third trimester, not applicable or unspecified: Secondary | ICD-10-CM

## 2016-11-29 NOTE — Progress Notes (Signed)
Pt reports no problems. Growth u/s today

## 2016-12-01 NOTE — Progress Notes (Signed)
Anemia: Has been seen by hematology and received IV iron. No mention made in hematologist's note regarding her positive antibody screen Will contact hematologist to make sure he is aware.  GDMA1: Patient reports fastings 85-105 and 2 hour pp all normal except 1 (?190). Did not bring log. Lost 3# Baby active. Growth scan: EFW 27.8% (4#) and AFI 17.92cm/ vertex/ ant placenta. Still need 4 chamber heart views Contraception: Nexplanon? FOB incarcerated ROB in 1 week-will get US to look for 4 chamber heart  Addendum: Dr Orlie DakinFinnegan now aware of positive antibody screen and has contacted Dr Oneita Krasubinas in charge of Blood Bank Patient has an appointment in Heme 8/8  Farrel Connersolleen Deonna Krummel, CNM

## 2016-12-05 ENCOUNTER — Ambulatory Visit (INDEPENDENT_AMBULATORY_CARE_PROVIDER_SITE_OTHER): Payer: Medicaid Other | Admitting: Obstetrics & Gynecology

## 2016-12-05 ENCOUNTER — Other Ambulatory Visit: Payer: Self-pay | Admitting: Certified Nurse Midwife

## 2016-12-05 ENCOUNTER — Ambulatory Visit (INDEPENDENT_AMBULATORY_CARE_PROVIDER_SITE_OTHER): Payer: Medicaid Other

## 2016-12-05 ENCOUNTER — Encounter: Payer: Medicaid Other | Admitting: Obstetrics & Gynecology

## 2016-12-05 VITALS — BP 128/80 | Wt 217.0 lb

## 2016-12-05 DIAGNOSIS — O0990 Supervision of high risk pregnancy, unspecified, unspecified trimester: Secondary | ICD-10-CM | POA: Diagnosis not present

## 2016-12-05 DIAGNOSIS — Z0489 Encounter for examination and observation for other specified reasons: Secondary | ICD-10-CM

## 2016-12-05 DIAGNOSIS — IMO0002 Reserved for concepts with insufficient information to code with codable children: Secondary | ICD-10-CM

## 2016-12-05 DIAGNOSIS — Z98891 History of uterine scar from previous surgery: Secondary | ICD-10-CM

## 2016-12-05 DIAGNOSIS — Z048 Encounter for examination and observation for other specified reasons: Secondary | ICD-10-CM

## 2016-12-05 DIAGNOSIS — Z8759 Personal history of other complications of pregnancy, childbirth and the puerperium: Secondary | ICD-10-CM

## 2016-12-05 DIAGNOSIS — O99210 Obesity complicating pregnancy, unspecified trimester: Secondary | ICD-10-CM

## 2016-12-05 DIAGNOSIS — Z3A33 33 weeks gestation of pregnancy: Secondary | ICD-10-CM

## 2016-12-05 DIAGNOSIS — O24410 Gestational diabetes mellitus in pregnancy, diet controlled: Secondary | ICD-10-CM

## 2016-12-05 MED ORDER — GLYBURIDE 1.25 MG PO TABS: 2.5000 mg | ORAL_TABLET | Freq: Two times a day (BID) | ORAL | 6 refills | Status: DC

## 2016-12-05 NOTE — Progress Notes (Signed)
1. GDMA1.  Cont home BS monitoring, trending to having more abnormals especially FBS.  Will start Glyburide 1.25 mg BID today; recheck one week        APT 36 weeks. 2. Obesity RF disucssed       US f/u later today (heart)       Baby ASA daily       Growth US [redacted] weeks along w APT 3. CS history, sch for Aug 21 4. PNV, FMC, PTL precautions

## 2016-12-05 NOTE — Patient Instructions (Signed)
Third Trimester of Pregnancy The third trimester is from week 28 through week 40 (months 7 through 9). The third trimester is a time when the unborn baby (fetus) is growing rapidly. At the end of the ninth month, the fetus is about 20 inches in length and weighs 6-10 pounds. Body changes during your third trimester Your body will continue to go through many changes during pregnancy. The changes vary from woman to woman. During the third trimester:  Your weight will continue to increase. You can expect to gain 25-35 pounds (11-16 kg) by the end of the pregnancy.  You may begin to get stretch marks on your hips, abdomen, and breasts.  You may urinate more often because the fetus is moving lower into your pelvis and pressing on your bladder.  You may develop or continue to have heartburn. This is caused by increased hormones that slow down muscles in the digestive tract.  You may develop or continue to have constipation because increased hormones slow digestion and cause the muscles that push waste through your intestines to relax.  You may develop hemorrhoids. These are swollen veins (varicose veins) in the rectum that can itch or be painful.  You may develop swollen, bulging veins (varicose veins) in your legs.  You may have increased body aches in the pelvis, back, or thighs. This is due to weight gain and increased hormones that are relaxing your joints.  You may have changes in your hair. These can include thickening of your hair, rapid growth, and changes in texture. Some women also have hair loss during or after pregnancy, or hair that feels dry or thin. Your hair will most likely return to normal after your baby is born.  Your breasts will continue to grow and they will continue to become tender. A yellow fluid (colostrum) may leak from your breasts. This is the first milk you are producing for your baby.  Your belly button may stick out.  You may notice more swelling in your hands,  face, or ankles.  You may have increased tingling or numbness in your hands, arms, and legs. The skin on your belly may also feel numb.  You may feel short of breath because of your expanding uterus.  You may have more problems sleeping. This can be caused by the size of your belly, increased need to urinate, and an increase in your body's metabolism.  You may notice the fetus "dropping," or moving lower in your abdomen (lightening).  You may have increased vaginal discharge.  You may notice your joints feel loose and you may have pain around your pelvic bone.  What to expect at prenatal visits You will have prenatal exams every 2 weeks until week 36. Then you will have weekly prenatal exams. During a routine prenatal visit:  You will be weighed to make sure you and the baby are growing normally.  Your blood pressure will be taken.  Your abdomen will be measured to track your baby's growth.  The fetal heartbeat will be listened to.  Any test results from the previous visit will be discussed.  You may have a cervical check near your due date to see if your cervix has softened or thinned (effaced).  You will be tested for Group B streptococcus. This happens between 35 and 37 weeks.  Your health care provider may ask you:  What your birth plan is.  How you are feeling.  If you are feeling the baby move.  If you have had   any abnormal symptoms, such as leaking fluid, bleeding, severe headaches, or abdominal cramping.  If you are using any tobacco products, including cigarettes, chewing tobacco, and electronic cigarettes.  If you have any questions.  Other tests or screenings that may be performed during your third trimester include:  Blood tests that check for low iron levels (anemia).  Fetal testing to check the health, activity level, and growth of the fetus. Testing is done if you have certain medical conditions or if there are problems during the  pregnancy.  Nonstress test (NST). This test checks the health of your baby to make sure there are no signs of problems, such as the baby not getting enough oxygen. During this test, a belt is placed around your belly. The baby is made to move, and its heart rate is monitored during movement.  What is false labor? False labor is a condition in which you feel small, irregular tightenings of the muscles in the womb (contractions) that usually go away with rest, changing position, or drinking water. These are called Braxton Hicks contractions. Contractions may last for hours, days, or even weeks before true labor sets in. If contractions come at regular intervals, become more frequent, increase in intensity, or become painful, you should see your health care provider. What are the signs of labor?  Abdominal cramps.  Regular contractions that start at 10 minutes apart and become stronger and more frequent with time.  Contractions that start on the top of the uterus and spread down to the lower abdomen and back.  Increased pelvic pressure and dull back pain.  A watery or bloody mucus discharge that comes from the vagina.  Leaking of amniotic fluid. This is also known as your "water breaking." It could be a slow trickle or a gush. Let your health care provider know if it has a color or strange odor. If you have any of these signs, call your health care provider right away, even if it is before your due date. Follow these instructions at home: Medicines  Follow your health care provider's instructions regarding medicine use. Specific medicines may be either safe or unsafe to take during pregnancy.  Take a prenatal vitamin that contains at least 600 micrograms (mcg) of folic acid.  If you develop constipation, try taking a stool softener if your health care provider approves. Eating and drinking  Eat a balanced diet that includes fresh fruits and vegetables, whole grains, good sources of protein  such as meat, eggs, or tofu, and low-fat dairy. Your health care provider will help you determine the amount of weight gain that is right for you.  Avoid raw meat and uncooked cheese. These carry germs that can cause birth defects in the baby.  If you have low calcium intake from food, talk to your health care provider about whether you should take a daily calcium supplement.  Eat four or five small meals rather than three large meals a day.  Limit foods that are high in fat and processed sugars, such as fried and sweet foods.  To prevent constipation: ? Drink enough fluid to keep your urine clear or pale yellow. ? Eat foods that are high in fiber, such as fresh fruits and vegetables, whole grains, and beans. Activity  Exercise only as directed by your health care provider. Most women can continue their usual exercise routine during pregnancy. Try to exercise for 30 minutes at least 5 days a week. Stop exercising if you experience uterine contractions.  Avoid heavy   lifting.  Do not exercise in extreme heat or humidity, or at high altitudes.  Wear low-heel, comfortable shoes.  Practice good posture.  You may continue to have sex unless your health care provider tells you otherwise. Relieving pain and discomfort  Take frequent breaks and rest with your legs elevated if you have leg cramps or low back pain.  Take warm sitz baths to soothe any pain or discomfort caused by hemorrhoids. Use hemorrhoid cream if your health care provider approves.  Wear a good support bra to prevent discomfort from breast tenderness.  If you develop varicose veins: ? Wear support pantyhose or compression stockings as told by your healthcare provider. ? Elevate your feet for 15 minutes, 3-4 times a day. Prenatal care  Write down your questions. Take them to your prenatal visits.  Keep all your prenatal visits as told by your health care provider. This is important. Safety  Wear your seat belt at  all times when driving.  Make a list of emergency phone numbers, including numbers for family, friends, the hospital, and police and fire departments. General instructions  Avoid cat litter boxes and soil used by cats. These carry germs that can cause birth defects in the baby. If you have a cat, ask someone to clean the litter box for you.  Do not travel far distances unless it is absolutely necessary and only with the approval of your health care provider.  Do not use hot tubs, steam rooms, or saunas.  Do not drink alcohol.  Do not use any products that contain nicotine or tobacco, such as cigarettes and e-cigarettes. If you need help quitting, ask your health care provider.  Do not use any medicinal herbs or unprescribed drugs. These chemicals affect the formation and growth of the baby.  Do not douche or use tampons or scented sanitary pads.  Do not cross your legs for long periods of time.  To prepare for the arrival of your baby: ? Take prenatal classes to understand, practice, and ask questions about labor and delivery. ? Make a trial run to the hospital. ? Visit the hospital and tour the maternity area. ? Arrange for maternity or paternity leave through employers. ? Arrange for family and friends to take care of pets while you are in the hospital. ? Purchase a rear-facing car seat and make sure you know how to install it in your car. ? Pack your hospital bag. ? Prepare the baby's nursery. Make sure to remove all pillows and stuffed animals from the baby's crib to prevent suffocation.  Visit your dentist if you have not gone during your pregnancy. Use a soft toothbrush to brush your teeth and be gentle when you floss. Contact a health care provider if:  You are unsure if you are in labor or if your water has broken.  You become dizzy.  You have mild pelvic cramps, pelvic pressure, or nagging pain in your abdominal area.  You have lower back pain.  You have persistent  nausea, vomiting, or diarrhea.  You have an unusual or bad smelling vaginal discharge.  You have pain when you urinate. Get help right away if:  Your water breaks before 37 weeks.  You have regular contractions less than 5 minutes apart before 37 weeks.  You have a fever.  You are leaking fluid from your vagina.  You have spotting or bleeding from your vagina.  You have severe abdominal pain or cramping.  You have rapid weight loss or weight gain.    You have shortness of breath with chest pain.  You notice sudden or extreme swelling of your face, hands, ankles, feet, or legs.  Your baby makes fewer than 10 movements in 2 hours.  You have severe headaches that do not go away when you take medicine.  You have vision changes. Summary  The third trimester is from week 28 through week 40, months 7 through 9. The third trimester is a time when the unborn baby (fetus) is growing rapidly.  During the third trimester, your discomfort may increase as you and your baby continue to gain weight. You may have abdominal, leg, and back pain, sleeping problems, and an increased need to urinate.  During the third trimester your breasts will keep growing and they will continue to become tender. A yellow fluid (colostrum) may leak from your breasts. This is the first milk you are producing for your baby.  False labor is a condition in which you feel small, irregular tightenings of the muscles in the womb (contractions) that eventually go away. These are called Braxton Hicks contractions. Contractions may last for hours, days, or even weeks before true labor sets in.  Signs of labor can include: abdominal cramps; regular contractions that start at 10 minutes apart and become stronger and more frequent with time; watery or bloody mucus discharge that comes from the vagina; increased pelvic pressure and dull back pain; and leaking of amniotic fluid. This information is not intended to replace advice  given to you by your health care provider. Make sure you discuss any questions you have with your health care provider. Document Released: 05/08/2001 Document Revised: 10/20/2015 Document Reviewed: 07/15/2012 Elsevier Interactive Patient Education  2017 [employer]  

## 2016-12-12 ENCOUNTER — Encounter: Payer: Medicaid Other | Admitting: Obstetrics & Gynecology

## 2016-12-14 ENCOUNTER — Ambulatory Visit (INDEPENDENT_AMBULATORY_CARE_PROVIDER_SITE_OTHER): Payer: Medicaid Other | Admitting: Advanced Practice Midwife

## 2016-12-14 VITALS — BP 128/88 | Wt 220.0 lb

## 2016-12-14 DIAGNOSIS — O0990 Supervision of high risk pregnancy, unspecified, unspecified trimester: Secondary | ICD-10-CM

## 2016-12-14 DIAGNOSIS — O24415 Gestational diabetes mellitus in pregnancy, controlled by oral hypoglycemic drugs: Secondary | ICD-10-CM

## 2016-12-14 DIAGNOSIS — Z3A35 35 weeks gestation of pregnancy: Secondary | ICD-10-CM

## 2016-12-14 NOTE — Progress Notes (Signed)
No vb. No lof.  

## 2016-12-14 NOTE — Progress Notes (Signed)
Has been taking the glyburide only at HS because the morning dose made her feel dizzy. Fasting levels have improved since starting the glyburide and after meals are primarily wnl. Reviewed plan of care going forward with growth scan, AFI, NST nv along with GBS. No Ctx's, LOF, VB.

## 2016-12-20 ENCOUNTER — Ambulatory Visit (INDEPENDENT_AMBULATORY_CARE_PROVIDER_SITE_OTHER): Payer: Medicaid Other

## 2016-12-20 ENCOUNTER — Encounter: Payer: Medicaid Other | Admitting: Advanced Practice Midwife

## 2016-12-20 ENCOUNTER — Ambulatory Visit (INDEPENDENT_AMBULATORY_CARE_PROVIDER_SITE_OTHER): Payer: Medicaid Other | Admitting: Advanced Practice Midwife

## 2016-12-20 VITALS — BP 128/86 | Wt 220.0 lb

## 2016-12-20 DIAGNOSIS — O24415 Gestational diabetes mellitus in pregnancy, controlled by oral hypoglycemic drugs: Secondary | ICD-10-CM

## 2016-12-20 DIAGNOSIS — O0990 Supervision of high risk pregnancy, unspecified, unspecified trimester: Secondary | ICD-10-CM | POA: Diagnosis not present

## 2016-12-20 DIAGNOSIS — Z3A35 35 weeks gestation of pregnancy: Secondary | ICD-10-CM

## 2016-12-20 DIAGNOSIS — Z362 Encounter for other antenatal screening follow-up: Secondary | ICD-10-CM | POA: Diagnosis not present

## 2016-12-20 NOTE — Progress Notes (Signed)
Afi/ nst today. No vb. No lof 

## 2016-12-20 NOTE — Progress Notes (Signed)
NST reactive today 135 bpm baseline Moderate variability + accelerations - decelerations  Growth scan 6#2oz, 47%, AFI 13.58 Continues with glyburide at HS. Blood sugars primarily in normal range. Several values are slightly elevated after meals. 1 fasting is 104- she did not take the glyburide the night before. Will do GBS at nv.

## 2016-12-28 ENCOUNTER — Encounter: Payer: Medicaid Other | Admitting: Obstetrics and Gynecology

## 2017-01-01 ENCOUNTER — Ambulatory Visit (INDEPENDENT_AMBULATORY_CARE_PROVIDER_SITE_OTHER): Payer: Medicaid Other | Admitting: Obstetrics and Gynecology

## 2017-01-01 VITALS — BP 138/90 | Wt 220.0 lb

## 2017-01-01 DIAGNOSIS — O361930 Maternal care for other isoimmunization, third trimester, not applicable or unspecified: Secondary | ICD-10-CM

## 2017-01-01 DIAGNOSIS — Z8759 Personal history of other complications of pregnancy, childbirth and the puerperium: Secondary | ICD-10-CM

## 2017-01-01 DIAGNOSIS — Z3A37 37 weeks gestation of pregnancy: Secondary | ICD-10-CM

## 2017-01-01 DIAGNOSIS — O99012 Anemia complicating pregnancy, second trimester: Secondary | ICD-10-CM

## 2017-01-01 DIAGNOSIS — O99210 Obesity complicating pregnancy, unspecified trimester: Secondary | ICD-10-CM | POA: Diagnosis not present

## 2017-01-01 DIAGNOSIS — O09299 Supervision of pregnancy with other poor reproductive or obstetric history, unspecified trimester: Secondary | ICD-10-CM

## 2017-01-01 DIAGNOSIS — O24415 Gestational diabetes mellitus in pregnancy, controlled by oral hypoglycemic drugs: Secondary | ICD-10-CM | POA: Diagnosis not present

## 2017-01-01 DIAGNOSIS — O0932 Supervision of pregnancy with insufficient antenatal care, second trimester: Secondary | ICD-10-CM

## 2017-01-01 DIAGNOSIS — O0990 Supervision of high risk pregnancy, unspecified, unspecified trimester: Secondary | ICD-10-CM

## 2017-01-01 DIAGNOSIS — Z6841 Body Mass Index (BMI) 40.0 and over, adult: Secondary | ICD-10-CM

## 2017-01-01 DIAGNOSIS — Z98891 History of uterine scar from previous surgery: Secondary | ICD-10-CM

## 2017-01-01 NOTE — Progress Notes (Signed)
Prenatal Visit Note Date: 01/01/2017 Clinic: [facility]  Subjective:  Lisa Richard is a 31 y.o. [id number] at 7564w4d being seen today for ongoing prenatal care.  She is currently monitored for the following issues for this high-risk pregnancy and has History of 3 cesarean sections; High risk pregnancy, antepartum; Late prenatal care affecting pregnancy in second trimester; History of gestational hypertension; Obesity affecting pregnancy, antepartum; BMI 40.0-44.9, adult (HCC); Maternal atypical antibody complicating pregnancy; Anemia affecting pregnancy in second trimester; History of maternal blood transfusion, currently pregnant; and Gestational diabetes on her problem list.   ----------------------------------------------------------------------------------- Patient reports carpal tunnel symptoms, no bleeding, no contractions and no leaking.   Contractions: Not present. Vag. Bleeding: None.  Movement: Present. Denies leaking of fluid.  GDM: did not bring BG log.  Notes most fastings are ok. But does not always take her glyburide.  Notes most 2h pp 110-130.   ----------------------------------------------------------------------------------- The following portions of the patient's history were reviewed and updated as appropriate: allergies, current medications, past family history, past medical history, past social history, past surgical history and problem list. Problem list updated.  Objective:   Vitals:   01/01/17 1556  BP: 138/90  Weight: 220 lb (99.8 kg)   Total weight gain for pregnancy: 19 lb (8.618 kg)   Fetal Status: Fetal Heart Rate (bpm): 155   Movement: Present     General:  Alert, oriented and cooperative. Patient is in no acute distress.  Skin: Skin is warm and dry. No rash noted.   Cardiovascular: Normal heart rate noted  Respiratory: Normal respiratory effort, no problems with respiration noted  Abdomen: Soft, gravid, appropriate for gestational age. Pain/Pressure:  Absent     Pelvic:  Cervical exam deferred        Extremities: Normal range of motion.     Mental Status: Normal mood and affect. Normal behavior. Normal judgment and thought content.   Urinalysis: Urine Protein: Negative Urine Glucose: Negative  Assessment and Plan:  Pregnancy: [id number] at 3664w4d 1. Obesity affecting pregnancy, antepartum - Strep Gp B NAA - GC/Chlamydia Probe Amp - US OB Limited; Future (AFI) 2. Maternal atypical antibody affecting pregnancy in third trimester, single or unspecified fetus 3. Late prenatal care affecting pregnancy in second trimester 4. History of maternal blood transfusion, currently pregnant 5. History of gestational hypertension 6. History of 3 cesarean sections c-section schedule 01/15/17 with Dr Tiburcio PeaHarris 7. High risk pregnancy, antepartum - Strep Gp B NAA - GC/Chlamydia Probe Amp - US OB Limited; Future (AFI) 8. BMI 40.0-44.9, adult (HCC) 9. Anemia affecting pregnancy in second trimester 10. Gestational diabetes mellitus (GDM) in third trimester controlled on oral hypoglycemic drug - Strep Gp B NAA - GC/Chlamydia Probe Amp - US OB Limited; Future (AFI) - COntinue glyburide. Strongly encouraged her to keep close tabs on her BG values and bring in her log.  Continue twice weekly APT (She has not been her in 1.5-2 weeks).  May have to increase her dosing of glyburide, if values not in range. - discussed risks of fetal death or comorbid conditions from no glycemic control 11. [redacted] weeks gestation of pregnancy - Strep Gp B NAA - GC/Chlamydia Probe Amp  Term labor symptoms and general obstetric precautions including but not limited to vaginal bleeding, contractions, leaking of fluid and fetal movement were reviewed in detail with the patient. Please refer to After Visit Summary for other counseling recommendations.   Return in about 3 days (around 01/04/2017) for schedule ultrasound for AFI and ROB with NST  after.  Thomasene Mohair, MD 01/01/2017 5:14 PM

## 2017-01-02 ENCOUNTER — Ambulatory Visit: Payer: Self-pay | Admitting: Oncology

## 2017-01-02 ENCOUNTER — Inpatient Hospital Stay (HOSPITAL_BASED_OUTPATIENT_CLINIC_OR_DEPARTMENT_OTHER): Payer: Medicaid Other | Admitting: Oncology

## 2017-01-02 ENCOUNTER — Inpatient Hospital Stay: Payer: Medicaid Other

## 2017-01-02 ENCOUNTER — Ambulatory Visit: Payer: Self-pay

## 2017-01-02 ENCOUNTER — Other Ambulatory Visit: Payer: Self-pay

## 2017-01-02 ENCOUNTER — Inpatient Hospital Stay: Payer: Medicaid Other | Attending: Oncology

## 2017-01-02 VITALS — BP 130/88 | HR 87 | Temp 97.9°F | Resp 18 | Wt 222.4 lb

## 2017-01-02 DIAGNOSIS — O99013 Anemia complicating pregnancy, third trimester: Secondary | ICD-10-CM

## 2017-01-02 DIAGNOSIS — E669 Obesity, unspecified: Secondary | ICD-10-CM | POA: Diagnosis not present

## 2017-01-02 DIAGNOSIS — D649 Anemia, unspecified: Secondary | ICD-10-CM | POA: Diagnosis not present

## 2017-01-02 DIAGNOSIS — Z87891 Personal history of nicotine dependence: Secondary | ICD-10-CM | POA: Insufficient documentation

## 2017-01-02 LAB — CBC WITH DIFFERENTIAL/PLATELET
Basophils Absolute: 0 10*3/uL (ref 0–0.1)
Basophils Relative: 1 %
Eosinophils Absolute: 0.1 10*3/uL (ref 0–0.7)
Eosinophils Relative: 1 %
HEMATOCRIT: 32.2 % — AB (ref 35.0–47.0)
HEMOGLOBIN: 10.4 g/dL — AB (ref 12.0–16.0)
LYMPHS ABS: 1.3 10*3/uL (ref 1.0–3.6)
Lymphocytes Relative: 24 %
MCH: 24.7 pg — AB (ref 26.0–34.0)
MCHC: 32.4 g/dL (ref 32.0–36.0)
MCV: 76.2 fL — ABNORMAL LOW (ref 80.0–100.0)
Monocytes Absolute: 0.3 10*3/uL (ref 0.2–0.9)
Monocytes Relative: 6 %
Neutro Abs: 3.8 10*3/uL (ref 1.4–6.5)
Neutrophils Relative %: 68 %
Platelets: 176 10*3/uL (ref 150–440)
RBC: 4.22 MIL/uL (ref 3.80–5.20)
RDW: 21.6 % — ABNORMAL HIGH (ref 11.5–14.5)
WBC: 5.6 10*3/uL (ref 3.6–11.0)

## 2017-01-02 LAB — IRON AND TIBC
Iron: 46 ug/dL (ref 28–170)
SATURATION RATIOS: 11 % (ref 10.4–31.8)
TIBC: 438 ug/dL (ref 250–450)
UIBC: 392 ug/dL

## 2017-01-02 LAB — FERRITIN: Ferritin: 20 ng/mL (ref 11–307)

## 2017-01-02 NOTE — Progress Notes (Signed)
[place]  [phone] [phone]) [phone]  ID: Lisa Richard OB: 23-Sep-1985  MR#: [id number]  ERD#:[id number]  Patient Care Team: Patient, No Pcp Per as PCP - General (General Practice)  CHIEF COMPLAINT: Anemia affecting pregnancy in the third trimester.  INTERVAL HISTORY: Patient returns to clinic today for repeat laboratory work and further evaluation. She continues to feel well and is asymptomatic. She has no neurologic complaints. She is gaining weight appropriately. She denies any recent fevers or illnesses. She denies any chest pain or shortness of breath. She has no nausea, vomiting, constipation, or diarrhea. She has no urinary complaints. Patient feels at her baseline and offers no specific complaints today.  REVIEW OF SYSTEMS:   Review of Systems  Constitutional: Negative.  Negative for fever, malaise/fatigue and weight loss.  Respiratory: Negative.  Negative for cough and shortness of breath.   Cardiovascular: Negative.  Negative for chest pain and leg swelling.  Gastrointestinal: Negative.  Negative for abdominal pain.  Genitourinary: Negative.   Skin: Negative.  Negative for rash.  Neurological: Negative.  Negative for weakness.  Psychiatric/Behavioral: Negative.  The patient is not nervous/anxious.     As per HPI. Otherwise, a complete review of systems is negative.  PAST MEDICAL HISTORY: Past Medical History:  Diagnosis Date  . Obesity     PAST SURGICAL HISTORY: Past Surgical History:  Procedure Laterality Date  . CESAREAN SECTION      x 2  . CESAREAN SECTION N/A 02/20/2016   Procedure: CESAREAN SECTION;  Surgeon: Will Bonnet, MD;  Location: [facility] ORS;  Service: Obstetrics;  Laterality: N/A;  . DILATION AND CURETTAGE OF UTERUS  2009   Missed AB    FAMILY HISTORY: Family History  Problem Relation Age of Onset  . Prostate cancer Father     ADVANCED DIRECTIVES (Y/N):  N  HEALTH MAINTENANCE: Social History    Substance Use Topics  . Smoking status: Former Research scientist (life sciences)  . Smokeless tobacco: Never Used  . Alcohol use No     Colonoscopy:  PAP:  Bone density:  Lipid panel:  No Known Allergies  Current Outpatient Prescriptions  Medication Sig Dispense Refill  . Blood Glucose Monitoring Suppl (ONE TOUCH ULTRA 2) w/Device KIT 1 kit by Does not apply route as directed. 1 each 8  . glucose blood (ONE TOUCH ULTRA TEST) test strip Use as directed 60 each 5  . glyBURIDE (DIABETA) 1.25 MG tablet Take 2 tablets (2.5 mg total) by mouth 2 (two) times daily with a meal. 60 tablet 6  . Lancets (ONETOUCH ULTRASOFT) lancets Use as instructed 100 each 12  . Prenat w/o A-FeCbGl-DSS-FA-DHA (CITRANATAL BLOOM DHA) 90-1 & 300 MG MISC Take 1 tablet by mouth daily. (Patient not taking: Reported on 01/04/2017) 30 each 11   No current facility-administered medications for this visit.     OBJECTIVE: Vitals:   01/02/17 1423  BP: 130/88  Pulse: 87  Resp: 18  Temp: 97.9 F (36.6 C)     Body mass index is 42.02 kg/m.    ECOG FS:0 - Asymptomatic  General: Well-developed, well-nourished, no acute distress. Eyes: Pink conjunctiva, anicteric sclera. Lungs: Clear to auscultation bilaterally. Heart: Regular rate and rhythm. No rubs, murmurs, or gallops. Abdomen: Appears appropriate for gestational age. Musculoskeletal: No edema, cyanosis, or clubbing. Neuro: Alert, answering all questions appropriately. Cranial nerves grossly intact. Skin: No rashes or petechiae noted. Psych: Normal affect.   LAB RESULTS:  Lab Results  Component Value Date   NA 137 10/11/2016  K 3.6 10/11/2016   CL 101 10/11/2016   CO2 21 10/11/2016   GLUCOSE 88 10/11/2016   BUN 5 (L) 10/11/2016   CREATININE 0.48 (L) 10/11/2016   CALCIUM 8.8 10/11/2016   PROT 6.5 10/11/2016   ALBUMIN 3.3 (L) 10/11/2016   AST 15 10/11/2016   ALT 8 10/11/2016   ALKPHOS 57 10/11/2016   BILITOT 0.4 10/11/2016   GFRNONAA 134 10/11/2016   GFRAA 154  10/11/2016    Lab Results  Component Value Date   WBC 5.6 01/02/2017   NEUTROABS 3.8 01/02/2017   HGB 10.4 (L) 01/02/2017   HCT 32.2 (L) 01/02/2017   MCV 76.2 (L) 01/02/2017   PLT 176 01/02/2017   Lab Results  Component Value Date   IRON 46 01/02/2017   TIBC 438 01/02/2017   IRONPCTSAT 11 01/02/2017   Lab Results  Component Value Date   FERRITIN 20 01/02/2017     STUDIES: No results found.  ASSESSMENT: Anemia affecting pregnancy in the third trimester  PLAN:    1. Anemia affecting pregnancy in the third trimester: Patient's hemoglobin has significantly improved and is now greater than 10.0. Her iron stores are now within normal limits. She does not require additional IV Feraheme today. Return to clinic in 3 months for repeat laboratory work and further evaluation. If her hemoglobin continues to improve, she likely can be discharged from clinic at that time. 2. Pregnancy: Patient is having a scheduled C-section on approximately January 15, 2017. Continue monitoring per OB/GYN. Follow-up as above. 3. Antibodies: Patient had type and screen drawn today for further evaluation of her known antibodies. Blood Bank aware in case patient needs transfusion at this time for C-section.  Approximately 30 minutes spent in discussion of which greater than 50% was consultation.  Patient expressed understanding and was in agreement with this plan. She also understands that She can call clinic at any time with any questions, concerns, or complaints.    Lloyd Huger, MD   01/05/2017 4:39 PM

## 2017-01-03 ENCOUNTER — Inpatient Hospital Stay: Payer: Medicaid Other

## 2017-01-03 ENCOUNTER — Other Ambulatory Visit: Payer: Self-pay

## 2017-01-03 DIAGNOSIS — O99013 Anemia complicating pregnancy, third trimester: Secondary | ICD-10-CM

## 2017-01-04 ENCOUNTER — Ambulatory Visit (INDEPENDENT_AMBULATORY_CARE_PROVIDER_SITE_OTHER): Payer: Medicaid Other

## 2017-01-04 ENCOUNTER — Ambulatory Visit (INDEPENDENT_AMBULATORY_CARE_PROVIDER_SITE_OTHER): Payer: Medicaid Other | Admitting: Obstetrics and Gynecology

## 2017-01-04 VITALS — BP 122/82 | Wt 224.0 lb

## 2017-01-04 DIAGNOSIS — O99210 Obesity complicating pregnancy, unspecified trimester: Secondary | ICD-10-CM | POA: Diagnosis not present

## 2017-01-04 DIAGNOSIS — Z8759 Personal history of other complications of pregnancy, childbirth and the puerperium: Secondary | ICD-10-CM

## 2017-01-04 DIAGNOSIS — Z3A38 38 weeks gestation of pregnancy: Secondary | ICD-10-CM

## 2017-01-04 DIAGNOSIS — Z6841 Body Mass Index (BMI) 40.0 and over, adult: Secondary | ICD-10-CM

## 2017-01-04 DIAGNOSIS — Z98891 History of uterine scar from previous surgery: Secondary | ICD-10-CM

## 2017-01-04 DIAGNOSIS — O0932 Supervision of pregnancy with insufficient antenatal care, second trimester: Secondary | ICD-10-CM

## 2017-01-04 DIAGNOSIS — O24415 Gestational diabetes mellitus in pregnancy, controlled by oral hypoglycemic drugs: Secondary | ICD-10-CM | POA: Diagnosis not present

## 2017-01-04 DIAGNOSIS — O0990 Supervision of high risk pregnancy, unspecified, unspecified trimester: Secondary | ICD-10-CM

## 2017-01-04 DIAGNOSIS — O09299 Supervision of pregnancy with other poor reproductive or obstetric history, unspecified trimester: Secondary | ICD-10-CM

## 2017-01-04 DIAGNOSIS — O99013 Anemia complicating pregnancy, third trimester: Secondary | ICD-10-CM

## 2017-01-04 DIAGNOSIS — O361930 Maternal care for other isoimmunization, third trimester, not applicable or unspecified: Secondary | ICD-10-CM

## 2017-01-04 LAB — TYPE AND SCREEN
ABO/RH(D): O POS
ANTIBODY SCREEN: POSITIVE
PT AG Type: POSITIVE

## 2017-01-04 LAB — STREP GP B NAA: Strep Gp B NAA: POSITIVE — AB

## 2017-01-04 LAB — GC/CHLAMYDIA PROBE AMP
CHLAMYDIA, DNA PROBE: NEGATIVE
Neisseria gonorrhoeae by PCR: NEGATIVE

## 2017-01-04 NOTE — Progress Notes (Signed)
Prenatal Visit Note Date: 01/04/2017 Clinic: [facility]  Subjective:  Lisa Richard is a 31 y.o. [id number] at 756w0d being seen today for ongoing prenatal care.  She is currently monitored for the following issues for this high-risk pregnancy and has History of 3 cesarean sections; High risk pregnancy, antepartum; Late prenatal care affecting pregnancy in second trimester; History of gestational hypertension; Obesity affecting pregnancy, antepartum; BMI 40.0-44.9, adult (HCC); Maternal atypical antibody complicating pregnancy; Anemia affecting pregnancy in third trimester; History of maternal blood transfusion, currently pregnant; and Gestational diabetes on her problem list.   ----------------------------------------------------------------------------------- Patient reports carpal tunnel symptoms, no bleeding, no contractions and no leaking.   Contractions: Not present. Vag. Bleeding: None.  Movement: Present. Denies leaking of fluid.  GDM: Brought BG log, > 50% of all values normal.   ----------------------------------------------------------------------------------- The following portions of the patient's history were reviewed and updated as appropriate: allergies, current medications, past family history, past medical history, past social history, past surgical history and problem list. Problem list updated.  Objective:   Vitals:   01/04/17 1508  BP: 122/82  Weight: 224 lb (101.6 kg)   Total weight gain for pregnancy: 23 lb (10.4 kg)   Fetal Status: Fetal Heart Rate (bpm): 145   Movement: Present  Presentation: Vertex (AFI 16.2)  General:  Alert, oriented and cooperative. Patient is in no acute distress.  Skin: Skin is warm and dry. No rash noted.   Cardiovascular: Normal heart rate noted  Respiratory: Normal respiratory effort, no problems with respiration noted  Abdomen: Soft, gravid, appropriate for gestational age. Pain/Pressure: Absent     Pelvic:  Cervical exam deferred         Extremities: Normal range of motion.     Mental Status: Normal mood and affect. Normal behavior. Normal judgment and thought content.   Urinalysis: Urine Protein: Negative Urine Glucose: Negative   AFI 16.2 cm NST Baseline FHR: 1545 beats/min Variability: moderate Accelerations: present Decelerations: absent Tocometry: not done  Interpretation:  INDICATIONS: gestational diabetes mellitus and obesity RESULTS:  A NST procedure was performed with FHR monitoring and a normal baseline established, appropriate time of 20-40 minutes of evaluation, and accels >2 seen w 15x15 characteristics.  Results show a REACTIVE NST.    Assessment and Plan:  Pregnancy: [id number] at 3165w4d 1. Obesity affecting pregnancy, antepartum - continue twice weekly NST - weekly AFI 2. Maternal atypical antibody affecting pregnancy in third trimester, single or unspecified fetus - know to hematology 3. Late prenatal care affecting pregnancy in second trimester 4. History of maternal blood transfusion, currently pregnant 5. History of gestational hypertension - normotensive today 6. History of 3 cesarean sections c-section schedule 01/15/17 with Dr Tiburcio PeaHarris 7. High risk pregnancy, antepartum 8. BMI 40.0-44.9, adult (HCC) 9. Anemia affecting pregnancy in second trimester 10. Gestational diabetes mellitus (GDM) in third trimester controlled on oral hypoglycemic drug - Continue glyburide at current dose.  - Continue twice weekly APT  - discussed risks of fetal death or comorbid conditions from no glycemic control 11. [redacted] weeks gestation of pregnancy  Term labor symptoms and general obstetric precautions including but not limited to vaginal bleeding, contractions, leaking of fluid and fetal movement were reviewed in detail with the patient. Please refer to After Visit Summary for other counseling recommendations.   Return in about 4 days (around 01/08/2017) for schedule NST and routine prenatal.  Thomasene MohairStephen  Jackson, MD 01/04/2017 4:02 PM

## 2017-01-08 ENCOUNTER — Encounter: Payer: Medicaid Other | Admitting: Obstetrics & Gynecology

## 2017-01-09 ENCOUNTER — Encounter: Payer: Medicaid Other | Admitting: Obstetrics & Gynecology

## 2017-01-09 ENCOUNTER — Ambulatory Visit (INDEPENDENT_AMBULATORY_CARE_PROVIDER_SITE_OTHER): Payer: Medicaid Other | Admitting: Advanced Practice Midwife

## 2017-01-09 VITALS — BP 120/82 | Wt 220.0 lb

## 2017-01-09 DIAGNOSIS — O24415 Gestational diabetes mellitus in pregnancy, controlled by oral hypoglycemic drugs: Secondary | ICD-10-CM | POA: Diagnosis not present

## 2017-01-09 DIAGNOSIS — O0990 Supervision of high risk pregnancy, unspecified, unspecified trimester: Secondary | ICD-10-CM

## 2017-01-09 DIAGNOSIS — Z3A38 38 weeks gestation of pregnancy: Secondary | ICD-10-CM

## 2017-01-09 NOTE — Progress Notes (Signed)
Pt reports pelvic pain and pressure. NST today.

## 2017-01-09 NOTE — Progress Notes (Signed)
NST reactive today. Good fetal movement 145 bpm baseline, moderate variability, +accelerations, -decelerations Blood sugars are primarily in normal range per patient report.  AFI/NST/ROB in 2 days Pre op visit on Monday C/section on Tuesday 8/21

## 2017-01-10 IMAGING — US US OB COMP LESS 14 WK
1 series · 13 of 28 positions shown · non-contrast
Comparison: 04/23/2011

CLINICAL DATA: Abdominal pain and cramping for 3 days. Positive
urine pregnancy test. Gestational age by LMP is 5 weeks and 0 days.

EXAM:
OBSTETRIC <14 WK US AND TRANSVAGINAL OB US
TECHNIQUE: Both transabdominal and transvaginal ultrasound examinations were
performed for complete evaluation of the gestation as well as the
maternal uterus, adnexal regions, and pelvic cul-de-sac.
Transvaginal technique was performed to assess early pregnancy.

[Series 1: us ob comp less 14 wk · 0.23mm/px · 13 of 118 slices shown]
[im 5/118]
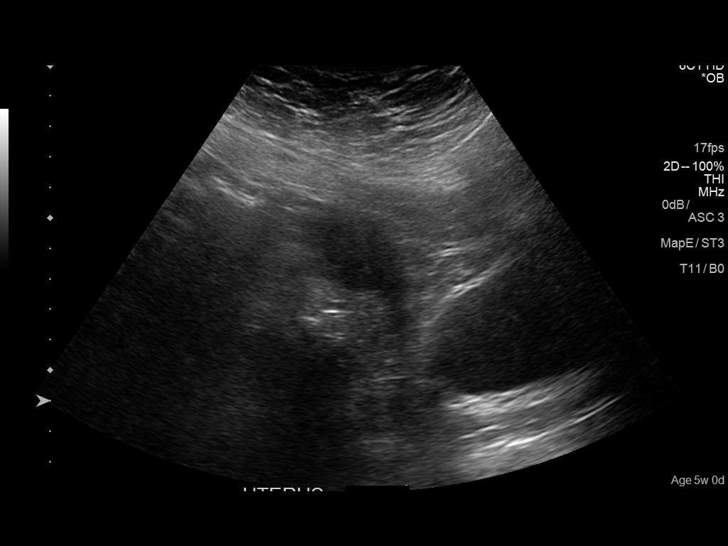
[im 14/118]
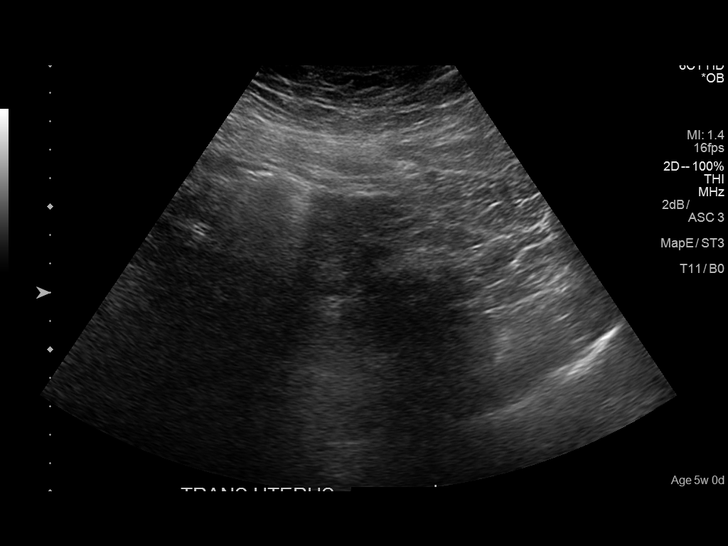
[im 22/118]
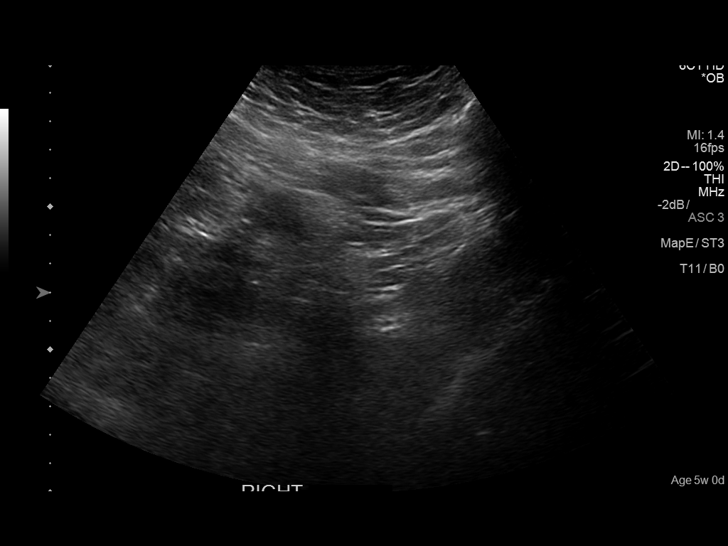
[im 31/118]
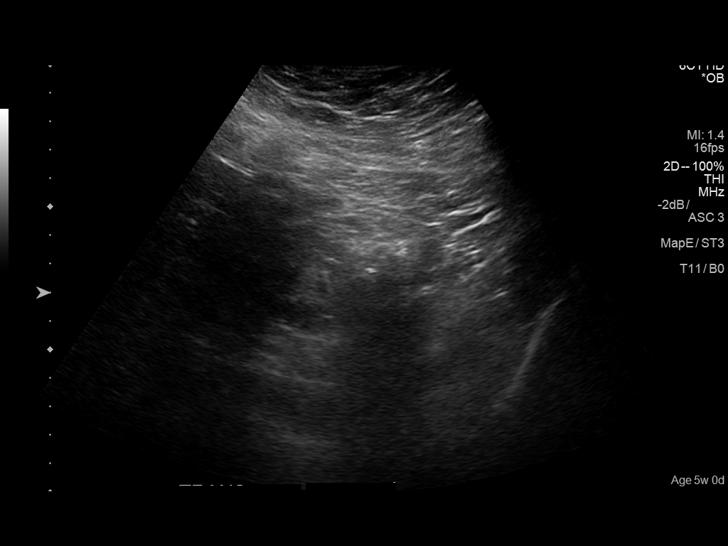
[im 40/118]
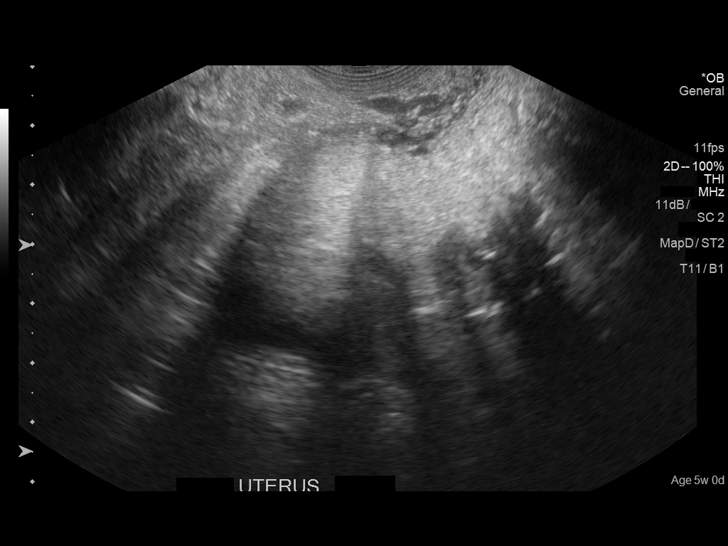
[im 48/118]
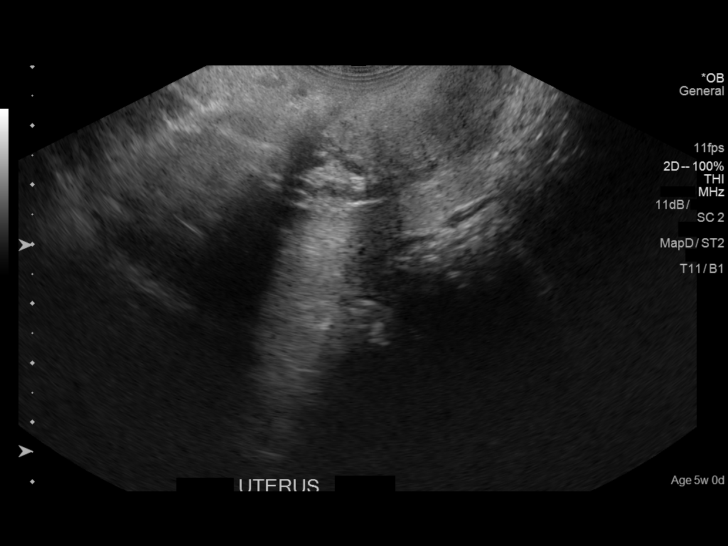
[im 61/118]
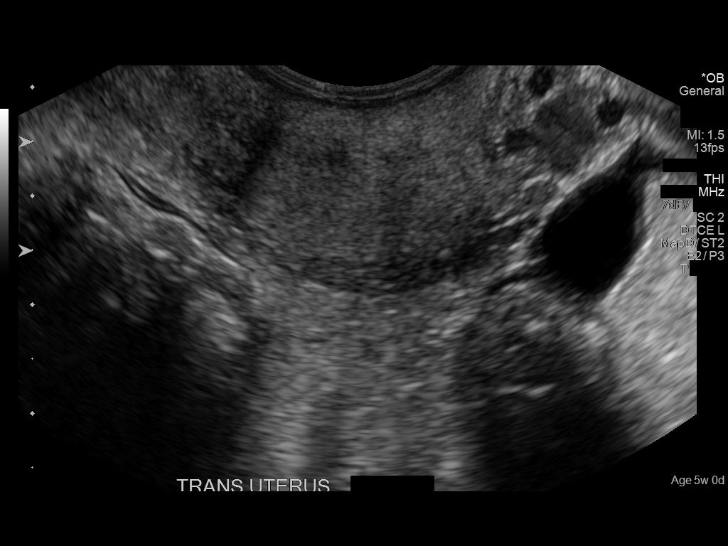
[im 70/118]
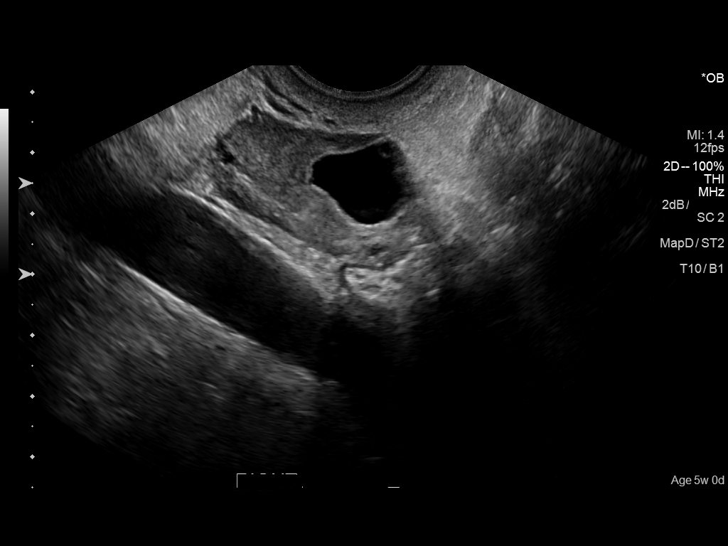
[im 79/118]
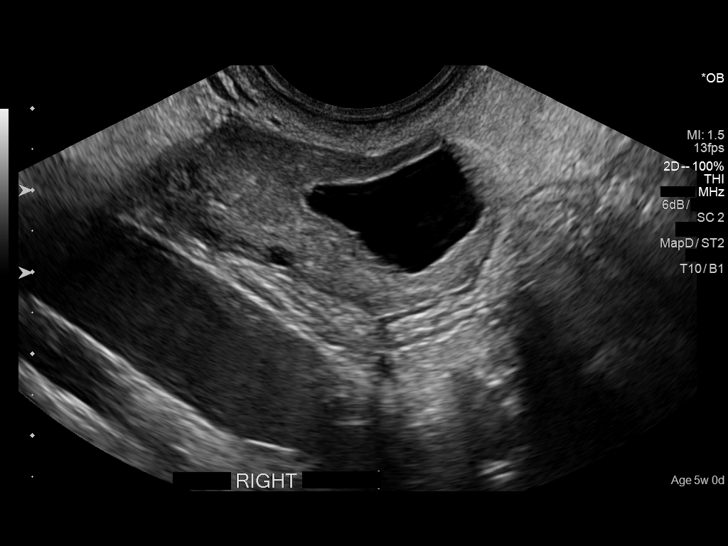
[im 87/118]
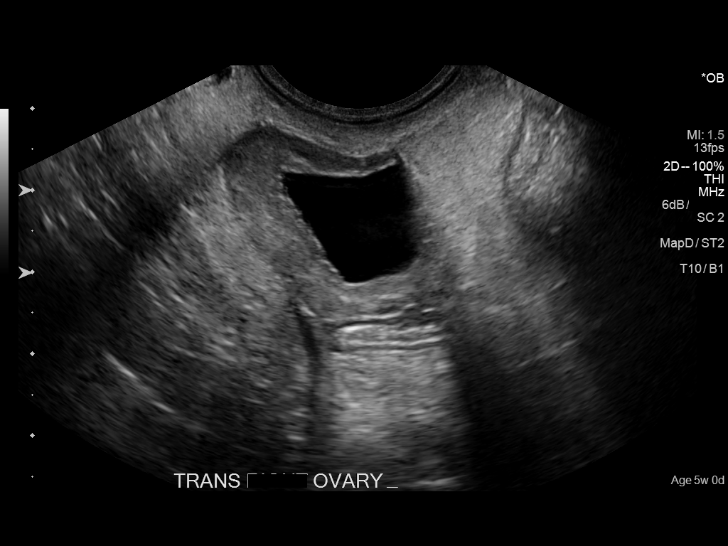
[im 96/118]
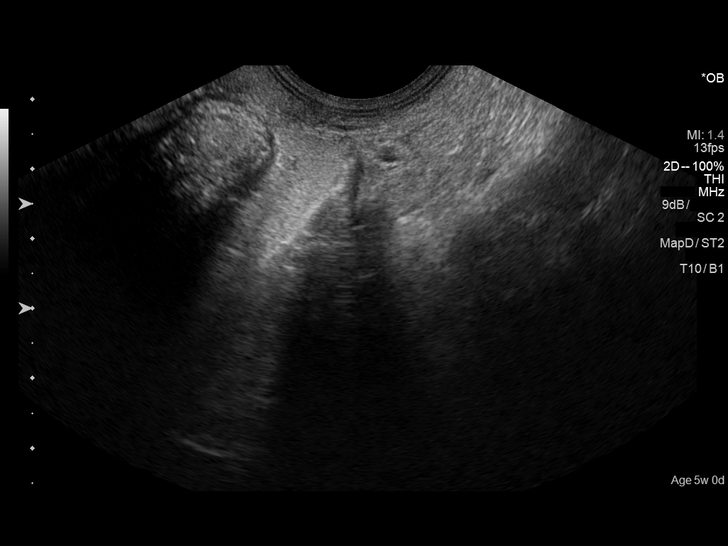
[im 105/118]
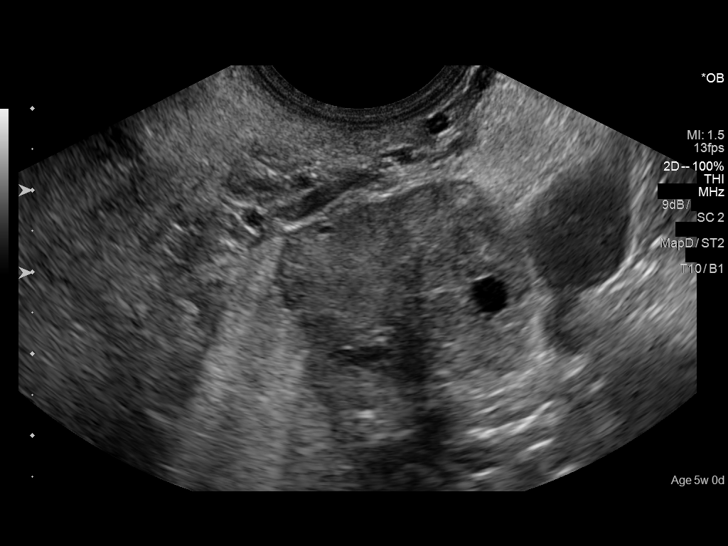
[im 113/118]
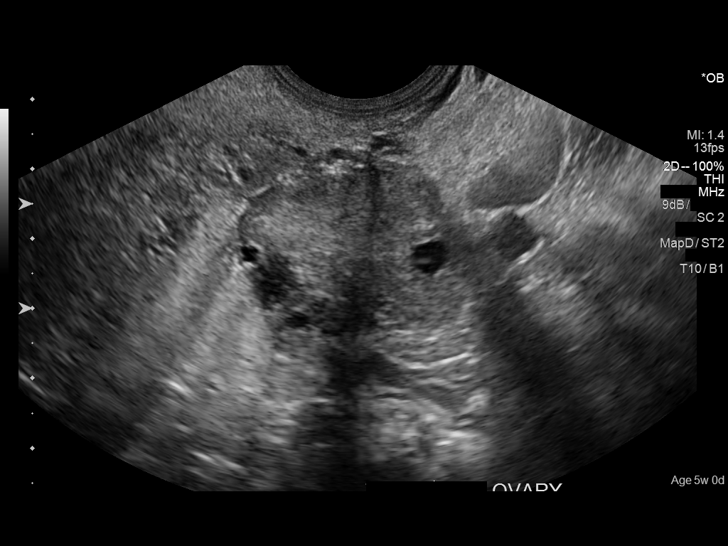

[13 of 28 positions shown; findings below may reference images not displayed]

FINDINGS: Intrauterine gestational sac: None

Maternal uterus/adnexae: No evidence for an intrauterine gestational
sac but the endometrium appears to be thickened, measuring up to
cm. Findings could represent a decidual reaction. The right ovary
measures 4.3 x 1.8 x 3.0 cm. Prominent right ovarian follicle
measuring up to 2.1 cm. A trace amount of free fluid. Left ovary
measures 4.1 x 3.0 x 4.2 cm. There is a structure in the left ovary
with peripheral vascularity that could be the corpus luteum. This
structure roughly measures up 2.3 cm.
IMPRESSION: No evidence for an intrauterine gestational sac. However, there is
thickening of the endometrium which could represent decidual
reaction. Findings could be related to an early pregnancy but an
early ectopic pregnancy cannot be excluded. Recommend follow-up
quantitative B-HCG levels and follow-up US.

## 2017-01-11 ENCOUNTER — Ambulatory Visit (INDEPENDENT_AMBULATORY_CARE_PROVIDER_SITE_OTHER): Payer: Medicaid Other

## 2017-01-11 ENCOUNTER — Ambulatory Visit (INDEPENDENT_AMBULATORY_CARE_PROVIDER_SITE_OTHER): Payer: Medicaid Other | Admitting: Obstetrics & Gynecology

## 2017-01-11 VITALS — BP 120/80 | Wt 222.0 lb

## 2017-01-11 DIAGNOSIS — O24415 Gestational diabetes mellitus in pregnancy, controlled by oral hypoglycemic drugs: Secondary | ICD-10-CM

## 2017-01-11 DIAGNOSIS — O99210 Obesity complicating pregnancy, unspecified trimester: Secondary | ICD-10-CM

## 2017-01-11 DIAGNOSIS — Z8759 Personal history of other complications of pregnancy, childbirth and the puerperium: Secondary | ICD-10-CM

## 2017-01-11 DIAGNOSIS — O0990 Supervision of high risk pregnancy, unspecified, unspecified trimester: Secondary | ICD-10-CM

## 2017-01-11 DIAGNOSIS — Z98891 History of uterine scar from previous surgery: Secondary | ICD-10-CM

## 2017-01-11 DIAGNOSIS — Z3A39 39 weeks gestation of pregnancy: Secondary | ICD-10-CM | POA: Diagnosis not present

## 2017-01-11 LAB — FETAL NONSTRESS TEST

## 2017-01-11 NOTE — Progress Notes (Signed)
Reviewed CS for next week.  Precuations for labor discussed.  PNV, FMC.  DM discussed, cont Glyburide, reported levels most all normal.  A NST procedure was performed with FHR monitoring and a normal baseline established, appropriate time of 20-40 minutes of evaluation, and accels >2 seen w 15x15 characteristics.  Results show a REACTIVE NST.   Review of ULTRASOUND.    I have personally reviewed images and report of recent ultrasound done at [facility].    Plan of management to be discussed with patient.

## 2017-01-14 ENCOUNTER — Encounter
Admission: RE | Admit: 2017-01-14 | Discharge: 2017-01-14 | Disposition: A | Discharge status: Home / Self Care | Payer: Medicaid Other | Source: Ambulatory Visit | Attending: Obstetrics & Gynecology | Admitting: Obstetrics & Gynecology

## 2017-01-14 ENCOUNTER — Encounter: Payer: Self-pay | Admitting: Obstetrics & Gynecology

## 2017-01-14 ENCOUNTER — Ambulatory Visit (INDEPENDENT_AMBULATORY_CARE_PROVIDER_SITE_OTHER): Payer: Medicaid Other | Admitting: Obstetrics & Gynecology

## 2017-01-14 VITALS — BP 140/90 | HR 95 | Ht 61.0 in | Wt 220.0 lb

## 2017-01-14 DIAGNOSIS — Z0183 Encounter for blood typing: Secondary | ICD-10-CM

## 2017-01-14 DIAGNOSIS — Z01812 Encounter for preprocedural laboratory examination: Secondary | ICD-10-CM

## 2017-01-14 DIAGNOSIS — Z8759 Personal history of other complications of pregnancy, childbirth and the puerperium: Secondary | ICD-10-CM

## 2017-01-14 DIAGNOSIS — Z3A39 39 weeks gestation of pregnancy: Secondary | ICD-10-CM

## 2017-01-14 DIAGNOSIS — O24415 Gestational diabetes mellitus in pregnancy, controlled by oral hypoglycemic drugs: Secondary | ICD-10-CM

## 2017-01-14 DIAGNOSIS — Z98891 History of uterine scar from previous surgery: Secondary | ICD-10-CM

## 2017-01-14 HISTORY — DX: Type 2 diabetes mellitus without complications: E11.9

## 2017-01-14 LAB — BASIC METABOLIC PANEL
ANION GAP: 9 (ref 5–15)
BUN: 6 mg/dL (ref 6–20)
CALCIUM: 9.1 mg/dL (ref 8.9–10.3)
CO2: 22 mmol/L (ref 22–32)
CREATININE: 0.54 mg/dL (ref 0.44–1.00)
Chloride: 107 mmol/L (ref 101–111)
GFR calc non Af Amer: >60 mL/min (ref >60)
Glucose, Bld: 101 mg/dL — ABNORMAL HIGH (ref 65–99)
Potassium: 3.4 mmol/L — ABNORMAL LOW (ref 3.5–5.1)
SODIUM: 138 mmol/L (ref 135–145)

## 2017-01-14 LAB — CBC
HEMATOCRIT: 30.7 % — AB (ref 35.0–47.0)
Hemoglobin: 10.3 g/dL — ABNORMAL LOW (ref 12.0–16.0)
MCH: 25.3 pg — ABNORMAL LOW (ref 26.0–34.0)
MCHC: 33.4 g/dL (ref 32.0–36.0)
MCV: 75.7 fL — AB (ref 80.0–100.0)
Platelets: 178 10*3/uL (ref 150–440)
RBC: 4.06 MIL/uL (ref 3.80–5.20)
RDW: 20.3 % — AB (ref 11.5–14.5)
WBC: 6.2 10*3/uL (ref 3.6–11.0)

## 2017-01-14 MED ORDER — CEFOXITIN SODIUM-DEXTROSE 2-2.2 GM-% IV SOLR (PREMIX)
2.0000 g | INTRAVENOUS | Status: AC
  Administered 2017-01-15: 2 g via INTRAVENOUS
  Filled 2017-01-14: qty 50

## 2017-01-14 NOTE — Patient Instructions (Signed)

## 2017-01-14 NOTE — Progress Notes (Signed)
PRE-OPERATIVE HISTORY AND PHYSICAL EXAM  HPI:  Lisa Richard is a 31 y.o. [id number].  Patient's last menstrual period was 04/06/2016 (approximate).  37w3dEstimated Date of Delivery: 01/18/17  She is being admitted for Elective repeat.  Pt has had gest DM this pregnancy on meds, mostly under good control w Glyburide and lifestyle changes.  Pt has h/o gest HTN but not this pregnancy.  Other risk factor include obesity, prior h/o 3 cesareans.  PMHx: She  has a past medical history of Obesity. Also,  has a past surgical history that includes Cesarean section; Cesarean section (N/A, 02/20/2016); and Dilation and curettage of uterus (2009)., family history includes Prostate cancer in her father.,  reports that she has quit smoking. She has never used smokeless tobacco. She reports that she does not drink alcohol or use drugs.   Current Outpatient Prescriptions:  .  Blood Glucose Monitoring Suppl (ONE TOUCH ULTRA 2) w/Device KIT, 1 kit by Does not apply route as directed., Disp: 1 each, Rfl: 8 .  glucose blood (ONE TOUCH ULTRA TEST) test strip, Use as directed, Disp: 60 each, Rfl: 5 .  glyBURIDE (DIABETA) 1.25 MG tablet, Take 2 tablets (2.5 mg total) by mouth 2 (two) times daily with a meal. (Patient taking differently: Take 1.25 mg by mouth at bedtime. ), Disp: 60 tablet, Rfl: 6 .  Lancets (ONETOUCH ULTRASOFT) lancets, Use as instructed, Disp: 100 each, Rfl: 12 .  Prenat w/o A-FeCbGl-DSS-FA-DHA (CITRANATAL BLOOM DHA) 90-1 & 300 MG MISC, Take 1 tablet by mouth daily. (Patient not taking: Reported on 01/04/2017), Disp: 30 each, Rfl: 11  Also, has No Known Allergies.  Review of Systems  Constitutional: Negative for chills, fever and malaise/fatigue.  HENT: Negative for congestion, sinus pain and sore throat.   Eyes: Negative for blurred vision and pain.  Respiratory: Negative for cough and wheezing.   Cardiovascular: Negative for chest pain and leg swelling.  Gastrointestinal: Negative for  abdominal pain, constipation, diarrhea, heartburn, nausea and vomiting.  Genitourinary: Negative for dysuria, frequency, hematuria and urgency.  Musculoskeletal: Negative for back pain, joint pain, myalgias and neck pain.  Skin: Negative for itching and rash.  Neurological: Negative for dizziness, tremors and weakness.  Endo/Heme/Allergies: Does not bruise/bleed easily.  Psychiatric/Behavioral: Negative for depression. The patient is not nervous/anxious and does not have insomnia.     Objective: BP 140/90   Pulse 95   Ht _0  (1.549 m)   Wt 220 lb (99.8 kg)   LMP 04/06/2016 (Approximate)   BMI 41.57 kg/m  Filed Weights   01/14/17 0823  Weight: 220 lb (99.8 kg)   Physical Exam  Vitals reviewed. Physical examination Constitutional NAD, Conversant  Skin No rashes, lesions or ulceration. Normal palpated skin turgor. No nodularity.  Lungs: Clear to auscultation.No rales or wheezes. Normal Respiratory effort, no retractions.  Heart: NSR.  No murmurs or rubs appreciated. No periferal edema  Abdomen: Gravid.  Non-tender.  No masses.  No HSM. No hernia  Extremities: Moves all appropriately.  Normal ROM for age. No lymphadenopathy.  Neuro: Grossly intact  Psych: Oriented to PPT.  Normal mood. Normal affect.     Pelvic:   Vulva: Normal appearance.  No lesions.  Vagina: No lesions or abnormalities noted.  Urethra No masses tenderness or scarring.  Meatus Normal size without lesions or prolapse.  Cervix: Not dilated.   Perineum: Normal exam.  No lesions.        Bimanual   Uterus: Enlarged.  Non-tender.    Adnexae: Not palpated.  Cul-de-sac: Negative for abnormality.    OB History  Gravida Para Term Preterm AB Living  _0 SAB TAB Ectopic Multiple Live Births  1       3    # Outcome Date GA Lbr Len/2nd Weight Sex Delivery Anes PTL Lv  5 Current           4 Term 02/23/16   8 lb 5 oz (3.771 kg) M CS-LTranv   LIV  3 Term 12/05/11    F CS-LTranv  N LIV  2 Term 05/31/09     M CS-LTranv  N LIV     Complications: GDM (gestational diabetes mellitus)  1 SAB 2009            Patient denies any other pertinent gynecologic issues. See prenatal record for more complete H&P  Assessment: 1. History of 3 cesarean sections   2. Gestational diabetes mellitus (GDM) in third trimester controlled on oral hypoglycemic drug   3. History of gestational hypertension   4. [redacted] weeks gestation of pregnancy     PLAN: 1.  Cesarean Delivery as Scheduled. Plans Nexplanon or other BC. Risks of adhesive dz and complications discussed as this is her 4th CS and last one was 11 mos ago.  Patient will undergo surgical management with Cesarean Section.   The risks of surgery were discussed in detail with the patient including but not limited to: bleeding which may require transfusion or reoperation; infection which may require antibiotics; injury to surrounding organs which may involve bowel, bladder, ureters ; need for additional procedures including laparoscopy or laparotomy; thromboembolic phenomenon, surgical site problems and other postoperative/anesthesia complications. Likelihood of success in alleviating the patient's condition was discussed. Routine postoperative instructions will be reviewed with the patient and her family in detail after surgery.  The patient concurred with the proposed plan, giving informed written consent for the surgery.  Patient will be NPO procedure.  Preoperative prophylactic antibiotics, as necessary, and SCDs ordered on call to the OR.   Barnett Applebaum, M.D. 01/14/2017 8:35 AM

## 2017-01-14 NOTE — Patient Instructions (Signed)
  Your procedure is scheduled on: 01/15/17 Tues Report to Emergency room Arrival time 5:45 am  Remember: Instructions that are not followed completely may result in serious medical risk, up to and including death, or upon the discretion of your surgeon and anesthesiologist your surgery may need to be rescheduled.    _x___ 1. Do not eat food or drink liquids after midnight. No gum chewing or                              hard candies.     __x__ 2. No Alcohol for 24 hours before or after surgery.   __x__3. No Smoking for 24 prior to surgery.   ____  4. Bring all medications with you on the day of surgery if instructed.    __x__ 5. Notify your doctor if there is any change in your medical condition     (cold, fever, infections).     Do not wear jewelry, make-up, hairpins, clips or nail polish.  Do not wear lotions, powders, or perfumes. You may wear deodorant.  Do not shave 48 hours prior to surgery. Men may shave face and neck.  Do not bring valuables to the hospital.    [facility] is not responsible for any belongings or valuables.               Contacts, dentures or bridgework may not be worn into surgery.  Leave your suitcase in the car. After surgery it may be brought to your room.  For patients admitted to the hospital, discharge time is determined by your                       treatment team.   Patients discharged the day of surgery will not be allowed to drive home.  You will need someone to drive you home and stay with you the night of your procedure.    Please read over the following fact sheets that you were given:   [facility] Preparing for Surgery and or MRSA Information   _x___ Take anti-hypertensive (unless it includes a diuretic), cardiac, seizure, asthma,     anti-reflux and psychiatric medicines. These include:  1. none  2.  3.  4.  5.  6.  ____Fleets enema or Magnesium Citrate as directed.   _x___ Use CHG Soap or sage wipes as directed on instruction sheet    ____ Use inhalers on the day of surgery and bring to hospital day of surgery  ____ Stop Metformin and Janumet 2 days prior to surgery.    ____ Take 1/2 of usual insulin dose the night before surgery and none on the morning     surgery.   _x___ Follow recommendations from Cardiologist, Pulmonologist or PCP regarding          stopping Aspirin, Coumadin, Pllavix ,Eliquis, Effient, or Pradaxa, and Pletal.  X____Stop Anti-inflammatories such as Advil, Aleve, Ibuprofen, Motrin, Naproxen, Naprosyn, Goodies powders or aspirin products. OK to take Tylenol and                          Celebrex.   _x___ Stop supplements until after surgery.  But may continue Vitamin D, Vitamin B,       and multivitamin.   ____ Bring C-Pap to the hospital.

## 2017-01-15 ENCOUNTER — Inpatient Hospital Stay
Admission: RE | Admit: 2017-01-15 | Discharge: 2017-01-18 | DRG: 765 | Disposition: A | Discharge status: Home / Self Care | Payer: Medicaid Other | Source: Ambulatory Visit | Attending: Obstetrics & Gynecology | Admitting: Obstetrics & Gynecology

## 2017-01-15 ENCOUNTER — Inpatient Hospital Stay: Payer: Medicaid Other | Admitting: Anesthesiology

## 2017-01-15 ENCOUNTER — Encounter: Payer: Self-pay | Admitting: Obstetrics and Gynecology

## 2017-01-15 ENCOUNTER — Telehealth: Payer: Self-pay

## 2017-01-15 ENCOUNTER — Encounter
Admission: RE | Disposition: A | Discharge status: Home / Self Care | Payer: Self-pay | Source: Ambulatory Visit | Attending: Obstetrics & Gynecology

## 2017-01-15 DIAGNOSIS — O99214 Obesity complicating childbirth: Secondary | ICD-10-CM | POA: Diagnosis present

## 2017-01-15 DIAGNOSIS — O24425 Gestational diabetes mellitus in childbirth, controlled by oral hypoglycemic drugs: Secondary | ICD-10-CM | POA: Diagnosis present

## 2017-01-15 DIAGNOSIS — O0932 Supervision of pregnancy with insufficient antenatal care, second trimester: Secondary | ICD-10-CM

## 2017-01-15 DIAGNOSIS — Z3A39 39 weeks gestation of pregnancy: Secondary | ICD-10-CM

## 2017-01-15 DIAGNOSIS — O99013 Anemia complicating pregnancy, third trimester: Secondary | ICD-10-CM | POA: Diagnosis present

## 2017-01-15 DIAGNOSIS — O361990 Maternal care for other isoimmunization, unspecified trimester, not applicable or unspecified: Secondary | ICD-10-CM | POA: Diagnosis present

## 2017-01-15 DIAGNOSIS — O0990 Supervision of high risk pregnancy, unspecified, unspecified trimester: Secondary | ICD-10-CM

## 2017-01-15 DIAGNOSIS — Z8759 Personal history of other complications of pregnancy, childbirth and the puerperium: Secondary | ICD-10-CM

## 2017-01-15 DIAGNOSIS — Z6841 Body Mass Index (BMI) 40.0 and over, adult: Secondary | ICD-10-CM | POA: Diagnosis not present

## 2017-01-15 DIAGNOSIS — O9081 Anemia of the puerperium: Secondary | ICD-10-CM | POA: Diagnosis not present

## 2017-01-15 DIAGNOSIS — Z87891 Personal history of nicotine dependence: Secondary | ICD-10-CM

## 2017-01-15 DIAGNOSIS — E669 Obesity, unspecified: Secondary | ICD-10-CM | POA: Diagnosis present

## 2017-01-15 DIAGNOSIS — O99210 Obesity complicating pregnancy, unspecified trimester: Secondary | ICD-10-CM | POA: Diagnosis present

## 2017-01-15 DIAGNOSIS — O24419 Gestational diabetes mellitus in pregnancy, unspecified control: Secondary | ICD-10-CM | POA: Diagnosis present

## 2017-01-15 DIAGNOSIS — D62 Acute posthemorrhagic anemia: Secondary | ICD-10-CM | POA: Diagnosis not present

## 2017-01-15 DIAGNOSIS — O34211 Maternal care for low transverse scar from previous cesarean delivery: Secondary | ICD-10-CM | POA: Diagnosis present

## 2017-01-15 DIAGNOSIS — Z98891 History of uterine scar from previous surgery: Secondary | ICD-10-CM

## 2017-01-15 DIAGNOSIS — O134 Gestational [pregnancy-induced] hypertension without significant proteinuria, complicating childbirth: Secondary | ICD-10-CM | POA: Diagnosis present

## 2017-01-15 LAB — CBC WITH DIFFERENTIAL/PLATELET
BASOS ABS: 0 10*3/uL (ref 0–0.1)
BASOS ABS: 0.1 10*3/uL (ref 0–0.1)
BASOS PCT: 1 %
Basophils Relative: 0 %
EOS PCT: 0 %
Eosinophils Absolute: 0 10*3/uL (ref 0–0.7)
Eosinophils Absolute: 0 10*3/uL (ref 0–0.7)
Eosinophils Relative: 0 %
HCT: 23.9 % — ABNORMAL LOW (ref 35.0–47.0)
HEMATOCRIT: 26.3 % — AB (ref 35.0–47.0)
HEMOGLOBIN: 7.5 g/dL — AB (ref 12.0–16.0)
HEMOGLOBIN: 8.5 g/dL — AB (ref 12.0–16.0)
LYMPHS ABS: 1.8 10*3/uL (ref 1.0–3.6)
LYMPHS PCT: 13 %
LYMPHS PCT: 8 %
Lymphs Abs: 1.2 10*3/uL (ref 1.0–3.6)
MCH: 24.5 pg — AB (ref 26.0–34.0)
MCH: 25.3 pg — ABNORMAL LOW (ref 26.0–34.0)
MCHC: 31.4 g/dL — ABNORMAL LOW (ref 32.0–36.0)
MCHC: 32.4 g/dL (ref 32.0–36.0)
MCV: 78.1 fL — ABNORMAL LOW (ref 80.0–100.0)
MCV: 78 fL — AB (ref 80.0–100.0)
Monocytes Absolute: 0.6 10*3/uL (ref 0.2–0.9)
Monocytes Absolute: 0.7 10*3/uL (ref 0.2–0.9)
Monocytes Relative: 4 %
Monocytes Relative: 5 %
NEUTROS ABS: 13 10*3/uL — AB (ref 1.4–6.5)
NEUTROS PCT: 83 %
NEUTROS PCT: 86 %
Neutro Abs: 11.5 10*3/uL — ABNORMAL HIGH (ref 1.4–6.5)
PLATELETS: 198 10*3/uL (ref 150–440)
Platelets: 167 10*3/uL (ref 150–440)
RBC: 3.06 MIL/uL — AB (ref 3.80–5.20)
RBC: 3.37 MIL/uL — AB (ref 3.80–5.20)
RDW: 20.2 % — ABNORMAL HIGH (ref 11.5–14.5)
RDW: 19.3 % — AB (ref 11.5–14.5)
WBC: 13.9 10*3/uL — AB (ref 3.6–11.0)
WBC: 14.9 10*3/uL — AB (ref 3.6–11.0)

## 2017-01-15 LAB — COMPREHENSIVE METABOLIC PANEL
ALBUMIN: 2.9 g/dL — AB (ref 3.5–5.0)
ALT: 10 U/L — ABNORMAL LOW (ref 14–54)
ANION GAP: 6 (ref 5–15)
AST: 27 U/L (ref 15–41)
Alkaline Phosphatase: 82 U/L (ref 38–126)
BILIRUBIN TOTAL: 0.9 mg/dL (ref 0.3–1.2)
BUN: 6 mg/dL (ref 6–20)
CHLORIDE: 106 mmol/L (ref 101–111)
CO2: 21 mmol/L — AB (ref 22–32)
Calcium: 8.6 mg/dL — ABNORMAL LOW (ref 8.9–10.3)
Creatinine, Ser: 0.57 mg/dL (ref 0.44–1.00)
GFR calc Af Amer: >60 mL/min (ref >60)
GFR calc non Af Amer: >60 mL/min (ref >60)
GLUCOSE: 84 mg/dL (ref 65–99)
POTASSIUM: 4 mmol/L (ref 3.5–5.1)
SODIUM: 133 mmol/L — AB (ref 135–145)
TOTAL PROTEIN: 6.7 g/dL (ref 6.5–8.1)

## 2017-01-15 LAB — CBC
HEMATOCRIT: 31.5 % — AB (ref 35.0–47.0)
Hemoglobin: 10.5 g/dL — ABNORMAL LOW (ref 12.0–16.0)
MCH: 25.2 pg — ABNORMAL LOW (ref 26.0–34.0)
MCHC: 33.3 g/dL (ref 32.0–36.0)
MCV: 75.6 fL — ABNORMAL LOW (ref 80.0–100.0)
PLATELETS: 171 10*3/uL (ref 150–440)
RBC: 4.17 MIL/uL (ref 3.80–5.20)
RDW: 20.5 % — ABNORMAL HIGH (ref 11.5–14.5)
WBC: 6.5 10*3/uL (ref 3.6–11.0)

## 2017-01-15 LAB — PROTEIN / CREATININE RATIO, URINE
CREATININE, URINE: 62 mg/dL
PROTEIN CREATININE RATIO: 0.1 mg/mg{creat} (ref 0.00–0.15)
Total Protein, Urine: 6 mg/dL

## 2017-01-15 LAB — RPR: RPR Ser Ql: NONREACTIVE

## 2017-01-15 LAB — PREPARE RBC (CROSSMATCH)

## 2017-01-15 LAB — GLUCOSE, CAPILLARY: GLUCOSE-CAPILLARY: 86 mg/dL (ref 65–99)

## 2017-01-15 SURGERY — Surgical Case
Anesthesia: Spinal

## 2017-01-15 MED ORDER — OXYTOCIN 40 UNITS IN LACTATED RINGERS INFUSION - SIMPLE MED
INTRAVENOUS | Status: DC | PRN
  Administered 2017-01-15: 500 mL via INTRAVENOUS

## 2017-01-15 MED ORDER — NALOXONE HCL 2 MG/2ML IJ SOSY
1.0000 ug/kg/h | PREFILLED_SYRINGE | INTRAVENOUS | Status: DC | PRN
  Filled 2017-01-15: qty 2

## 2017-01-15 MED ORDER — LACTATED RINGERS IV SOLN
INTRAVENOUS | Status: DC
  Administered 2017-01-15: 08:00:00 via INTRAVENOUS

## 2017-01-15 MED ORDER — DIPHENHYDRAMINE HCL 25 MG PO CAPS: 25.0000 mg | ORAL_CAPSULE | ORAL | Status: DC | PRN

## 2017-01-15 MED ORDER — KETOROLAC TROMETHAMINE 30 MG/ML IJ SOLN: 30.0000 mg | Freq: Four times a day (QID) | INTRAMUSCULAR | Status: DC

## 2017-01-15 MED ORDER — LACTATED RINGERS IV SOLN
INTRAVENOUS | Status: DC
  Administered 2017-01-15: 999 mL/h via INTRAVENOUS

## 2017-01-15 MED ORDER — PRENATAL MULTIVITAMIN CH
1.0000 | ORAL_TABLET | Freq: Every day | ORAL | Status: DC
  Administered 2017-01-16 – 2017-01-18 (×3): 1 via ORAL
  Filled 2017-01-15 (×3): qty 1

## 2017-01-15 MED ORDER — PHENYLEPHRINE HCL 10 MG/ML IJ SOLN
INTRAMUSCULAR | Status: AC
  Filled 2017-01-15: qty 1

## 2017-01-15 MED ORDER — EPHEDRINE SULFATE 50 MG/ML IJ SOLN
INTRAMUSCULAR | Status: AC
  Filled 2017-01-15: qty 1

## 2017-01-15 MED ORDER — NALBUPHINE HCL 10 MG/ML IJ SOLN: 5.0000 mg | Freq: Once | INTRAMUSCULAR | Status: DC | PRN

## 2017-01-15 MED ORDER — SENNOSIDES-DOCUSATE SODIUM 8.6-50 MG PO TABS
2.0000 | ORAL_TABLET | ORAL | Status: DC
  Administered 2017-01-16 – 2017-01-17 (×3): 2 via ORAL
  Filled 2017-01-15 (×3): qty 2

## 2017-01-15 MED ORDER — LABETALOL HCL 5 MG/ML IV SOLN
10.0000 mg | INTRAVENOUS | Status: DC | PRN
  Filled 2017-01-15: qty 4

## 2017-01-15 MED ORDER — ZOLPIDEM TARTRATE 5 MG PO TABS: 5.0000 mg | ORAL_TABLET | Freq: Every evening | ORAL | Status: DC | PRN

## 2017-01-15 MED ORDER — OXYTOCIN 40 UNITS IN LACTATED RINGERS INFUSION - SIMPLE MED
2.5000 [IU]/h | INTRAVENOUS | Status: DC
  Filled 2017-01-15: qty 1000

## 2017-01-15 MED ORDER — WITCH HAZEL-GLYCERIN EX PADS: 1.0000 "application " | MEDICATED_PAD | CUTANEOUS | Status: DC | PRN

## 2017-01-15 MED ORDER — AMMONIA AROMATIC IN INHA
RESPIRATORY_TRACT | Status: AC
  Filled 2017-01-15: qty 10

## 2017-01-15 MED ORDER — DIPHENHYDRAMINE HCL 50 MG/ML IJ SOLN: 12.5000 mg | INTRAMUSCULAR | Status: DC | PRN

## 2017-01-15 MED ORDER — OXYTOCIN 40 UNITS IN LACTATED RINGERS INFUSION - SIMPLE MED: 20.0000 [IU]/h | Freq: Once | INTRAVENOUS | Status: DC

## 2017-01-15 MED ORDER — BUPIVACAINE HCL (PF) 0.5 % IJ SOLN
INTRAMUSCULAR | Status: AC
  Filled 2017-01-15: qty 30

## 2017-01-15 MED ORDER — SODIUM CHLORIDE 0.9 % IV SOLN
INTRAVENOUS | Status: DC | PRN
  Administered 2017-01-15: 50 ug/min via INTRAVENOUS

## 2017-01-15 MED ORDER — MENTHOL 3 MG MT LOZG
1.0000 | LOZENGE | OROMUCOSAL | Status: DC | PRN
  Filled 2017-01-15: qty 9

## 2017-01-15 MED ORDER — NALBUPHINE HCL 10 MG/ML IJ SOLN: 5.0000 mg | INTRAMUSCULAR | Status: DC | PRN

## 2017-01-15 MED ORDER — SODIUM CHLORIDE 0.9% FLUSH: 3.0000 mL | INTRAVENOUS | Status: DC | PRN

## 2017-01-15 MED ORDER — OXYTOCIN 40 UNITS IN LACTATED RINGERS INFUSION - SIMPLE MED
INTRAVENOUS | Status: AC
  Filled 2017-01-15: qty 1000

## 2017-01-15 MED ORDER — OXYCODONE-ACETAMINOPHEN 5-325 MG PO TABS
2.0000 | ORAL_TABLET | ORAL | Status: DC | PRN
Start: 2017-01-15 — End: 2017-01-18
  Administered 2017-01-15 – 2017-01-18 (×13): 2 via ORAL
  Filled 2017-01-15 (×12): qty 2

## 2017-01-15 MED ORDER — BUPIVACAINE 0.25 % ON-Q PUMP DUAL CATH 400 ML
400.0000 mL | INJECTION | Status: DC
  Filled 2017-01-15: qty 400

## 2017-01-15 MED ORDER — BUPIVACAINE IN DEXTROSE 0.75-8.25 % IT SOLN
INTRATHECAL | Status: AC
  Filled 2017-01-15: qty 2

## 2017-01-15 MED ORDER — MORPHINE SULFATE-NACL 0.5-0.9 MG/ML-% IV SOSY
PREFILLED_SYRINGE | INTRAVENOUS | Status: DC | PRN
  Administered 2017-01-15: .1 mg via EPIDURAL

## 2017-01-15 MED ORDER — KETOROLAC TROMETHAMINE 30 MG/ML IJ SOLN
30.0000 mg | Freq: Four times a day (QID) | INTRAMUSCULAR | Status: AC
  Administered 2017-01-15 – 2017-01-16 (×3): 30 mg via INTRAVENOUS
  Filled 2017-01-15 (×3): qty 1

## 2017-01-15 MED ORDER — DIPHENHYDRAMINE HCL 50 MG/ML IJ SOLN: 25.0000 mg | Freq: Once | INTRAMUSCULAR | Status: DC

## 2017-01-15 MED ORDER — BUPIVACAINE IN DEXTROSE 0.75-8.25 % IT SOLN
INTRATHECAL | Status: DC | PRN
Start: 2017-01-15 — End: 2017-01-15
  Administered 2017-01-15: 1.6 mL via INTRATHECAL

## 2017-01-15 MED ORDER — OXYCODONE-ACETAMINOPHEN 5-325 MG PO TABS
1.0000 | ORAL_TABLET | ORAL | Status: DC | PRN
Start: 2017-01-15 — End: 2017-01-18

## 2017-01-15 MED ORDER — LABETALOL HCL 5 MG/ML IV SOLN
10.0000 mg | Freq: Once | INTRAVENOUS | Status: AC
  Administered 2017-01-15: 10 mg via INTRAVENOUS

## 2017-01-15 MED ORDER — DIPHENHYDRAMINE HCL 25 MG PO CAPS
25.0000 mg | ORAL_CAPSULE | Freq: Four times a day (QID) | ORAL | Status: DC | PRN
  Administered 2017-01-15: 25 mg via ORAL
  Filled 2017-01-15: qty 1

## 2017-01-15 MED ORDER — FENTANYL CITRATE (PF) 100 MCG/2ML IJ SOLN: 25.0000 ug | INTRAMUSCULAR | Status: DC | PRN

## 2017-01-15 MED ORDER — NALOXONE HCL 0.4 MG/ML IJ SOLN
0.4000 mg | INTRAMUSCULAR | Status: DC | PRN
Start: 2017-01-15 — End: 2017-01-15

## 2017-01-15 MED ORDER — MEPERIDINE HCL 25 MG/ML IJ SOLN: 6.2500 mg | INTRAMUSCULAR | Status: DC | PRN

## 2017-01-15 MED ORDER — ACETAMINOPHEN 500 MG PO TABS: 1000.0000 mg | ORAL_TABLET | Freq: Four times a day (QID) | ORAL | Status: DC

## 2017-01-15 MED ORDER — SIMETHICONE 80 MG PO CHEW
80.0000 mg | CHEWABLE_TABLET | ORAL | Status: DC
  Administered 2017-01-16 – 2017-01-17 (×3): 80 mg via ORAL
  Filled 2017-01-15 (×3): qty 1

## 2017-01-15 MED ORDER — PROPOFOL 10 MG/ML IV BOLUS
INTRAVENOUS | Status: AC
  Filled 2017-01-15: qty 20

## 2017-01-15 MED ORDER — SODIUM CHLORIDE 0.9 % IV SOLN
Freq: Once | INTRAVENOUS | Status: AC
  Administered 2017-01-15: 13:00:00 via INTRAVENOUS

## 2017-01-15 MED ORDER — SOD CITRATE-CITRIC ACID 500-334 MG/5ML PO SOLN
30.0000 mL | ORAL | Status: AC
  Administered 2017-01-15: 30 mL via ORAL
  Filled 2017-01-15: qty 15

## 2017-01-15 MED ORDER — LABETALOL HCL 5 MG/ML IV SOLN
INTRAVENOUS | Status: AC
  Administered 2017-01-15: 10 mg via INTRAVENOUS
  Filled 2017-01-15: qty 4

## 2017-01-15 MED ORDER — MISOPROSTOL 200 MCG PO TABS
ORAL_TABLET | ORAL | Status: AC
  Administered 2017-01-15: 800 ug
  Filled 2017-01-15: qty 4

## 2017-01-15 MED ORDER — TETANUS-DIPHTH-ACELL PERTUSSIS 5-2.5-18.5 LF-MCG/0.5 IM SUSP: 0.5000 mL | Freq: Once | INTRAMUSCULAR | Status: DC

## 2017-01-15 MED ORDER — MORPHINE SULFATE (PF) 0.5 MG/ML IJ SOLN
INTRAMUSCULAR | Status: AC
  Filled 2017-01-15: qty 10

## 2017-01-15 MED ORDER — ONDANSETRON HCL 4 MG/2ML IJ SOLN
INTRAMUSCULAR | Status: AC
  Filled 2017-01-15: qty 4

## 2017-01-15 MED ORDER — COCONUT OIL OIL
1.0000 "application " | TOPICAL_OIL | Status: DC | PRN
  Administered 2017-01-17: 1 via TOPICAL
  Filled 2017-01-15: qty 120

## 2017-01-15 MED ORDER — LACTATED RINGERS IV SOLN
INTRAVENOUS | Status: DC
  Administered 2017-01-15: 125 mL/h via INTRAVENOUS

## 2017-01-15 MED ORDER — SIMETHICONE 80 MG PO CHEW
80.0000 mg | CHEWABLE_TABLET | Freq: Three times a day (TID) | ORAL | Status: DC
  Administered 2017-01-15 – 2017-01-18 (×9): 80 mg via ORAL
  Filled 2017-01-15 (×9): qty 1

## 2017-01-15 MED ORDER — BUPIVACAINE HCL 0.5 % IJ SOLN
10.0000 mL | Freq: Once | INTRAMUSCULAR | Status: DC
  Filled 2017-01-15: qty 10

## 2017-01-15 MED ORDER — BUPIVACAINE HCL (PF) 0.5 % IJ SOLN
INTRAMUSCULAR | Status: DC | PRN
  Administered 2017-01-15: 8 mL

## 2017-01-15 MED ORDER — OXYCODONE-ACETAMINOPHEN 5-325 MG PO TABS
ORAL_TABLET | ORAL | Status: AC
  Filled 2017-01-15: qty 2

## 2017-01-15 MED ORDER — DIBUCAINE 1 % RE OINT: 1.0000 "application " | TOPICAL_OINTMENT | RECTAL | Status: DC | PRN

## 2017-01-15 MED ORDER — MORPHINE SULFATE (PF) 2 MG/ML IV SOLN
1.0000 mg | INTRAVENOUS | Status: DC | PRN
  Filled 2017-01-15: qty 1

## 2017-01-15 MED ORDER — ONDANSETRON HCL 4 MG/2ML IJ SOLN
4.0000 mg | Freq: Once | INTRAMUSCULAR | Status: AC | PRN
  Administered 2017-01-15: 8 mg via INTRAVENOUS

## 2017-01-15 MED ORDER — SIMETHICONE 80 MG PO CHEW: 80.0000 mg | CHEWABLE_TABLET | ORAL | Status: DC | PRN

## 2017-01-15 MED ORDER — LACTATED RINGERS IV SOLN
INTRAVENOUS | Status: DC
  Administered 2017-01-16: 01:00:00 via INTRAVENOUS

## 2017-01-15 MED ORDER — ACETAMINOPHEN 325 MG PO TABS: 650.0000 mg | ORAL_TABLET | ORAL | Status: DC | PRN

## 2017-01-15 SURGICAL SUPPLY — 25 items
CANISTER SUCT 3000ML PPV (MISCELLANEOUS) ×3 IMPLANT
CATH KIT ON-Q SILVERSOAK 5IN (CATHETERS) ×6 IMPLANT
CHLORAPREP W/TINT 26ML (MISCELLANEOUS) ×6 IMPLANT
DERMABOND ADVANCED (GAUZE/BANDAGES/DRESSINGS) ×4
DERMABOND ADVANCED .7 DNX12 (GAUZE/BANDAGES/DRESSINGS) ×2 IMPLANT
DRSG OPSITE POSTOP 4X10 (GAUZE/BANDAGES/DRESSINGS) ×3 IMPLANT
ELECT CAUTERY BLADE 6.4 (BLADE) IMPLANT
ELECT REM PT RETURN 9FT ADLT (ELECTROSURGICAL) ×3
ELECTRODE REM PT RTRN 9FT ADLT (ELECTROSURGICAL) ×1 IMPLANT
GLOVE SKINSENSE NS SZ8.0 LF (GLOVE) ×2
GLOVE SKINSENSE STRL SZ8.0 LF (GLOVE) ×1 IMPLANT
GOWN STRL REUS W/ TWL LRG LVL3 (GOWN DISPOSABLE) ×1 IMPLANT
GOWN STRL REUS W/ TWL XL LVL3 (GOWN DISPOSABLE) ×2 IMPLANT
GOWN STRL REUS W/TWL LRG LVL3 (GOWN DISPOSABLE) ×2
GOWN STRL REUS W/TWL XL LVL3 (GOWN DISPOSABLE) ×4
NS IRRIG 1000ML POUR BTL (IV SOLUTION) ×3 IMPLANT
PACK C SECTION AR (MISCELLANEOUS) ×3 IMPLANT
PAD OB MATERNITY 4.3X12.25 (PERSONAL CARE ITEMS) ×3 IMPLANT
PAD PREP 24X41 OB/GYN DISP (PERSONAL CARE ITEMS) ×3 IMPLANT
SPONGE LAP 18X18 5 PK (GAUZE/BANDAGES/DRESSINGS) ×3 IMPLANT
SUCT VACUUM KIWI BELL (SUCTIONS) ×6 IMPLANT
SUT MAXON ABS #0 GS21 30IN (SUTURE) ×6 IMPLANT
SUT VIC AB 1 CT1 36 (SUTURE) ×6 IMPLANT
SUT VIC AB 2-0 CT1 36 (SUTURE) IMPLANT
SUT VIC AB 4-0 FS2 27 (SUTURE) ×3 IMPLANT

## 2017-01-15 NOTE — Anesthesia Post-op Follow-up Note (Signed)
Anesthesia QCDR form completed.        

## 2017-01-15 NOTE — Transfer of Care (Signed)
Immediate Anesthesia Transfer of Care Note  Patient: Lisa Richard  Procedure(s) Performed: Procedure(s): CESAREAN SECTION (N/A)  Patient Location: Mother/Baby  Anesthesia Type:Spinal  Level of Consciousness: awake, oriented and patient cooperative  Airway & Oxygen Therapy: Patient Spontanous Breathing  Post-op Assessment: Report given to RN and Post -op Vital signs reviewed and stable  Post vital signs: Reviewed and stable  Last Vitals:  Vitals:   01/15/17 0733 01/15/17 0902  BP: 131/81 133/65  Pulse: 74 70  Resp:  14  Temp:  (!) 36.4 C  SpO2:  100%    Last Pain:  Vitals:   01/15/17 0902  TempSrc: Oral  PainSc:          Complications: No apparent anesthesia complications

## 2017-01-15 NOTE — H&P (Signed)
History and Physical Interval Note:  01/15/2017 7:24 AM  Lisa Richard  has presented today for surgery, with the diagnosis of REPEAT CESAREAN,GESTATIONAL DAIBETES  The various methods of treatment have been discussed with the patient and family. After consideration of risks, benefits and other options for treatment, the patient has consented to  Procedure(s): CESAREAN SECTION (N/A) as a surgical intervention .  The patient's history has been reviewed, patient examined, no change in status, stable for surgery.  Pt has the following beta blocker history-  Not taking Beta Blocker.  I have reviewed the patient's chart and labs.  Questions were answered to the patient's satisfaction.    Pt noted to have elevated Blood Pressures this am; denies headache, blurry vision, epigastric pain, CP, or SOB, min edema.  BP up and down yesterday at appointments.  Risks of preeclampsia discussed.  Labetalol 10 mg IV given.  Labs.  Still will proceed with CS today.  [name]

## 2017-01-15 NOTE — Progress Notes (Signed)
Called MD to update him on patient status. Patient is receiving RBC, IV pit still infusing at 62.5, fundus is firm, however patient is still having steady bright red trickle of blood from the vaginal area.

## 2017-01-15 NOTE — Discharge Summary (Signed)
OB Discharge Summary     Patient Name: Lisa Richard DOB: Sep 28, 1985 MRN: [id number]  Date of admission: 01/15/2017 Delivering MD: Rod Can, CNM  Date of Delivery: 01/15/2017  Date of discharge: 01/18/2017  Admitting diagnosis: REPEAT CESAREAN,GESTATIONAL DAIBETES Intrauterine pregnancy: [redacted]w[redacted]d    Secondary diagnosis: Gestational Diabetes medication controlled (A2)     Discharge diagnosis: Term Pregnancy Delivered, Gestational Hypertension, GDM A2 and Obesity affecting pregnancy; 39 weeks, No other diagnosis                         Hospital course:  Sceduled C/S   31y.o. yo [id number] admitted to the hospital 01/15/2017 for scheduled cesarean section with the following indication:Elective Repeat.  Membrane Rupture Time/Date: at time of cesarean Patient delivered a Viable infant.01/15/2017  Details of operation can be found in separate operative note.  Pateint had an uncomplicated postpartum course.  She is ambulating, tolerating a regular diet, passing flatus, and urinating well. Patient is discharged home in stable condition on  01/18/17                                                                        Post partum procedures:blood transfusion  Complications: [id number] blood loss  Physical exam on 01/18/2017: Vitals:   01/17/17 2000 01/17/17 2327 01/18/17 0258 01/18/17 0754  BP: (!) 146/77 135/76 (!) 157/87 (!) 164/87  Pulse: 85 85 92 82  Resp: 20 18 20 18   Temp: (!) 97.4 F (36.3 C) 98.2 F (36.8 C) 98.2 F (36.8 C) 98.2 F (36.8 C)  TempSrc: Oral Oral Oral Oral  SpO2: 100% 99% 98% 99%  Weight:      Height:       General: alert, cooperative and no distress Lochia: appropriate Uterine Fundus: firm Incision: Dressing is clean, dry, and intact DVT Evaluation: No evidence of DVT seen on physical exam.  Labs: Lab Results  Component Value Date   WBC 8.1 01/17/2017   HGB 8.0 (L) 01/17/2017   HCT 24.4 (L) 01/17/2017   MCV 78.1 (L) 01/17/2017   PLT 148  (L) 01/17/2017   CMP Latest Ref Rng & Units 01/17/2017  Glucose 65 - 99 mg/dL 94  BUN 6 - 20 mg/dL 10  Creatinine 0.44 - 1.00 mg/dL 0.83  Sodium 135 - 145 mmol/L 136  Potassium 3.5 - 5.1 mmol/L 3.7  Chloride 101 - 111 mmol/L 105  CO2 22 - 32 mmol/L 26  Calcium 8.9 - 10.3 mg/dL 8.6(L)  Total Protein 6.5 - 8.1 g/dL 5.6(L)  Total Bilirubin 0.3 - 1.2 mg/dL 0.5  Alkaline Phos 38 - 126 U/L 56  AST 15 - 41 U/L 19  ALT 14 - 54 U/L 9(L)    Discharge instruction: per After Visit Summary.  Medications:  Allergies as of 01/18/2017   No Known Allergies     Medication List    STOP taking these medications   glyBURIDE 1.25 MG tablet Commonly known as:  DIABETA     TAKE these medications   CITRANATAL BLOOM DHA 90-1 & 300 MG Misc Take 1 tablet by mouth daily.   glucose blood test strip Commonly known as:  ONE TOUCH ULTRA TEST Use as  directed   ibuprofen 600 MG tablet Commonly known as:  ADVIL,MOTRIN Take 1 tablet (600 mg total) by mouth every 6 (six) hours as needed for fever or moderate pain.   labetalol 200 MG tablet Commonly known as:  NORMODYNE Take 1 tablet (200 mg total) by mouth 2 (two) times daily.   ONE TOUCH ULTRA 2 w/Device Kit 1 kit by Does not apply route as directed.   onetouch ultrasoft lancets Use as instructed   oxyCODONE-acetaminophen 5-325 MG tablet Commonly known as:  PERCOCET/ROXICET Take 1 tablet by mouth every 4 (four) hours as needed for severe pain (pain scale 4-7).            Discharge Care Instructions        Start     Ordered   01/18/17 0000  oxyCODONE-acetaminophen (PERCOCET/ROXICET) 5-325 MG tablet  Every 4 hours PRN     01/18/17 1107   01/18/17 0000  labetalol (NORMODYNE) 200 MG tablet  2 times daily     01/18/17 1107   01/18/17 0000  ibuprofen (ADVIL,MOTRIN) 600 MG tablet  Every 6 hours PRN     01/18/17 1107   01/18/17 0000  Call MD for:  persistant nausea and vomiting     01/18/17 1107   01/18/17 0000  Call MD for:  severe  uncontrolled pain     01/18/17 1107   01/18/17 0000  Call MD for:  redness, tenderness, or signs of infection (pain, swelling, redness, odor or green/yellow discharge around incision site)     01/18/17 1107   01/18/17 0000  Call MD for:  difficulty breathing, headache or visual disturbances     01/18/17 1107   01/18/17 0000  Call MD for:  persistant dizziness or light-headedness     01/18/17 1107   01/18/17 0000  Activity as tolerated     01/18/17 1107   01/18/17 0000  Lifting restrictions    Comments:  Weight restriction of 10 lbs. May lift/carry baby.   01/18/17 1107   01/18/17 0000  Driving restriction    Comments:  Avoid driving for at least 1 weeks. Do not drive on narcotics.   01/18/17 1107   01/18/17 0000  Sexual acrtivity    Comments:  Please refrain from intercourse until after you are cleared by your doctor/midwife at your 6 week visit.   01/18/17 1107   01/18/17 0000  Discharge wound care:    Comments:  Keep incision dry, clean.   01/18/17 1107   01/18/17 0000  Diet general     01/18/17 1107      Diet: routine diet  Activity: Advance as tolerated. Pelvic rest for 6 weeks.   Outpatient follow up: Follow-up Information    Gae Dry, MD. Go in 2 week(s).   Specialty:  Obstetrics and Gynecology Why:  blood pressure and incision check Contact information: [address] [place] [address] [phone]             Postpartum contraception: considering Nexplanon,  Rhogam Given postpartum: no Rubella vaccine given postpartum: no Varicella vaccine given postpartum: no TDaP given antepartum or postpartum: AP  Newborn Data: Live born female  Birth Weight: 8 lb 4.6 oz (3760 g) APGAR: 8, 9   Baby Feeding: Bottle and Breast  Disposition:home with mother  SIGNED: Rod Can, CNM 01/18/2017 11:07 AM

## 2017-01-15 NOTE — Anesthesia Preprocedure Evaluation (Signed)
Anesthesia Evaluation  Patient identified by MRN, date of birth, ID band Patient awake    Reviewed: Allergy & Precautions, NPO status , Patient's Chart, lab work & pertinent test results  History of Anesthesia Complications Negative for: history of anesthetic complications  Airway Mallampati: III       Dental   Pulmonary neg pulmonary ROS, former smoker,           Cardiovascular (-) hypertension(-) CHF negative cardio ROS  (-) dysrhythmias (-) Valvular Problems/Murmurs     Neuro/Psych negative neurological ROS     GI/Hepatic negative GI ROS, Neg liver ROS,   Endo/Other  diabetes, Type 2, Oral Hypoglycemic Agents  Renal/GU negative Renal ROS     Musculoskeletal   Abdominal   Peds  Hematology   Anesthesia Other Findings   Reproductive/Obstetrics (+) Pregnancy                             Anesthesia Physical Anesthesia Plan  ASA: II  Anesthesia Plan: Spinal   Post-op Pain Management:    Induction:   PONV Risk Score and Plan:   Airway Management Planned:   Additional Equipment:   Intra-op Plan:   Post-operative Plan:   Informed Consent: I have reviewed the patients History and Physical, chart, labs and discussed the procedure including the risks, benefits and alternatives for the proposed anesthesia with the patient or authorized representative who has indicated his/her understanding and acceptance.     Plan Discussed with:   Anesthesia Plan Comments:         Anesthesia Quick Evaluation

## 2017-01-15 NOTE — Anesthesia Procedure Notes (Signed)
Spinal  Patient location during procedure: OR Start time: 01/15/2017 7:50 AM End time: 01/15/2017 7:56 AM Staffing Performed: anesthesiologist  Preanesthetic Checklist Completed: patient identified, site marked, surgical consent, pre-op evaluation, timeout performed, IV checked, risks and benefits discussed and monitors and equipment checked Spinal Block Patient position: sitting Prep: Betadine Patient monitoring: heart rate, continuous pulse ox, blood pressure and cardiac monitor Approach: midline Location: L4-5 Injection technique: single-shot Needle Needle type: Whitacre and Introducer  Needle gauge: 24 G Needle length: 9 cm Needle insertion depth: 8 cm Additional Notes Negative paresthesia. Negative blood return. Positive free-flowing CSF. Expiration date of kit checked and confirmed. Patient tolerated procedure well, without complications.

## 2017-01-15 NOTE — Op Note (Signed)
Cesarean Section Procedure Note Indications: prior cesarean section and term intrauterine pregnancy  Pre-operative Diagnosis: Intrauterine pregnancy [redacted]w[redacted]d ;  prior cesarean section and term intrauterine pregnancy Post-operative Diagnosis: same, delivered. Procedure: Low Transverse Cesarean Section Surgeon: Annamarie Major, MD, FACOG Assistant(s): Bonney Aid Anesthesia: Spinal anesthesia Estimated Blood Loss:1000 Complications: None; patient tolerated the procedure well. Disposition: PACU - hemodynamically stable. Condition: stable  Findings: A female infant in the cephalic presentation. Amniotic fluid - Clear  Birth weight 8-5 lbs.  Apgars of 8 and 9.  Intact placenta with a three-vessel cord. Grossly normal uterus, tubes and ovaries bilaterally. Lower uterine segment intraabdominal adhesions were noted.  Thin LUS.  Procedure Details   The patient was taken to Operating Room, identified as the correct patient and the procedure verified as C-Section Delivery. A Time Out was held and the above information confirmed. After induction of anesthesia, the patient was draped and prepped in the usual sterile manner. A Pfannenstiel incision was made and carried down through the subcutaneous tissue to the fascia. Fascial incision was made and extended transversely with the Mayo scissors. The fascia was separated from the underlying rectus tissue superiorly and inferiorly. The peritoneum was identified and entered bluntly. Peritoneal incision was extended longitudinally. The utero-vesical peritoneal reflection was incised transversely and a bladder flap was created digitally.  A low transverse hysterotomy was made. The fetus was delivered atraumatically. The umbilical cord was clamped x2 and cut and the infant was handed to the awaiting pediatricians. The placenta was removed intact and appeared normal with a 3-vessel cord.  The uterus was exteriorized and cleared of all clot and debris. The hysterotomy was  closed with running sutures of 0 Vicryl suture.  Excellent hemostasis was observed. The uterus was returned to the abdomen. The pelvis was irrigated and again, excellent hemostasis was noted.  The On Q Pain pump System was then placed.  Trocars were placed through the abdominal wall into the subfascial space and these were used to thread the silver soaker cathaters into place.The rectus fascia was then reapproximated with running sutures of Maxon, with careful placement not to incorporate the cathaters. Subcutaneous tissues are then irrigated with saline and hemostasis assured.  Skin is then closed with 4-0 vicryl suture in a subcuticular fashion followed by skin adhesive. The cathaters are flushed each with 5 mL of Bupivicaine and stabilized into place with dressing. Instrument, sponge, and needle counts were correct prior to the abdominal closure and at the conclusion of the case.  The patient tolerated the procedure well and was transferred to the recovery room in stable condition.   Annamarie Major, MD, [facility], [facility] 01/15/2017  9:01 AM

## 2017-01-15 NOTE — Progress Notes (Signed)
Admit Date: 01/15/2017 Today's Date: 01/15/2017  Subjective: Postpartum Day 0: Cesarean Delivery Patient reports incisional pain.   Lightheadedness, some bleeding noted.    EBL surgery 1000 mL, EBL since delivery 900 mL at this time  Objective: Vital signs in last 24 hours: Temp:  [97.5 F (36.4 C)-98.6 F (37 C)] 97.5 F (36.4 C) (08/21 0902) Pulse Rate:  [65-106] 80 (08/21 1136) Resp:  [14-16] 14 (08/21 1030) BP: (76-171)/(33-98) 76/33 (08/21 1136) SpO2:  [97 %-100 %] 100 % (08/21 1030) Weight:  [220 lb (99.8 kg)] 220 lb (99.8 kg) (08/21 0717)  Physical Exam:  General: alert, cooperative and no distress Lochia: moderate bleeding Uterine Fundus: firm most of the time, intermittant massage Incision: healing well DVT Evaluation: No evidence of DVT seen on physical exam.   Recent Labs  01/14/17 1018 01/15/17 0703  HGB 10.3* 10.5*  HCT 30.7* 31.5*    Assessment/Plan: Status post Cesarean section. Hemorrhage with blood loss and hypotension, no tachycardia or desaturation.  O2, Fluid bolus, Cont Pitocin, Cytotec 800 mcg PR now. Monitor for further blood loss. CBC stat for assessment Consider transfusion, risks discussed.  Prior consent signed.  [name] 01/15/2017, 11:51 AM

## 2017-01-16 ENCOUNTER — Encounter: Payer: Self-pay | Admitting: Obstetrics & Gynecology

## 2017-01-16 LAB — CBC
HEMATOCRIT: 21.7 % — AB (ref 35.0–47.0)
HEMOGLOBIN: 7.3 g/dL — AB (ref 12.0–16.0)
MCH: 26 pg (ref 26.0–34.0)
MCHC: 33.6 g/dL (ref 32.0–36.0)
MCV: 77.4 fL — AB (ref 80.0–100.0)
Platelets: 137 10*3/uL — ABNORMAL LOW (ref 150–440)
RBC: 2.81 MIL/uL — ABNORMAL LOW (ref 3.80–5.20)
RDW: 19 % — ABNORMAL HIGH (ref 11.5–14.5)
WBC: 9.2 10*3/uL (ref 3.6–11.0)

## 2017-01-16 MED ORDER — IBUPROFEN 600 MG PO TABS
600.0000 mg | ORAL_TABLET | Freq: Four times a day (QID) | ORAL | Status: DC | PRN
  Administered 2017-01-16 – 2017-01-18 (×6): 600 mg via ORAL
  Filled 2017-01-16 (×6): qty 1

## 2017-01-16 MED ORDER — SODIUM CHLORIDE 0.9% FLUSH
3.0000 mL | Freq: Three times a day (TID) | INTRAVENOUS | Status: DC
  Administered 2017-01-16 – 2017-01-18 (×4): 3 mL via INTRAVENOUS

## 2017-01-16 NOTE — Progress Notes (Signed)
Subjective:  Doing well, no concerns.  Pain well controlled on po analgesics.  Tolerating po.  Minimal to moderate lochia.  Denies CP, SOB, headaches, vision changes, RUQ or epigastric pain, dizzy ness.    Objective:   Blood pressure (!) 153/80, pulse 77, temperature 97.6 F (36.4 C), temperature source Oral, resp. rate 18, height 5' 1"  (1.549 m), weight 220 lb (99.8 kg), last menstrual period 04/06/2016, SpO2 100 %, unknown if currently breastfeeding. Intake/Output      08/21 0701 - 08/22 0700 08/22 0701 - 08/23 0700   P.O. 840    I.V. (mL/kg) 3495 (35)    Blood 835    Total Intake(mL/kg) 5170 (51.8)    Urine (mL/kg/hr) 1850 (0.8) 350 (1.7)   Blood 3553    Total Output 5403 350   Net -233 -350          General: NAD Pulmonary: no increased work of breathing Abdomen: non-distended, non-tender, fundus firm at level of umbilicus Incision: D/C/I Extremities: no edema, no erythema, no tenderness  Results for orders placed or performed during the hospital encounter of 01/15/17 (from the past 72 hour(s))  Glucose, capillary     Status: None   Collection Time: 01/15/17  6:27 AM  Result Value Ref Range   Glucose-Capillary 86 65 - 99 mg/dL  CBC     Status: Abnormal   Collection Time: 01/15/17  7:03 AM  Result Value Ref Range   WBC 6.5 3.6 - 11.0 K/uL   RBC 4.17 3.80 - 5.20 MIL/uL   Hemoglobin 10.5 (L) 12.0 - 16.0 g/dL   HCT 31.5 (L) 35.0 - 47.0 %   MCV 75.6 (L) 80.0 - 100.0 fL   MCH 25.2 (L) 26.0 - 34.0 pg   MCHC 33.3 32.0 - 36.0 g/dL   RDW 20.5 (H) 11.5 - 14.5 %   Platelets 171 150 - 440 K/uL  Comprehensive metabolic panel     Status: Abnormal   Collection Time: 01/15/17  7:03 AM  Result Value Ref Range   Sodium 133 (L) 135 - 145 mmol/L   Potassium 4.0 3.5 - 5.1 mmol/L   Chloride 106 101 - 111 mmol/L   CO2 21 (L) 22 - 32 mmol/L   Glucose, Bld 84 65 - 99 mg/dL   BUN 6 6 - 20 mg/dL   Creatinine, Ser 0.57 0.44 - 1.00 mg/dL   Calcium 8.6 (L) 8.9 - 10.3 mg/dL   Total  Protein 6.7 6.5 - 8.1 g/dL   Albumin 2.9 (L) 3.5 - 5.0 g/dL   AST 27 15 - 41 U/L   ALT 10 (L) 14 - 54 U/L   Alkaline Phosphatase 82 38 - 126 U/L   Total Bilirubin 0.9 0.3 - 1.2 mg/dL   GFR calc non Af Amer >60 >60 mL/min   GFR calc Af Amer >60 >60 mL/min    Comment: (NOTE) The eGFR has been calculated using the CKD EPI equation. This calculation has not been validated in all clinical situations. eGFR's persistently <60 mL/min signify possible Chronic Kidney Disease.    Anion gap 6 5 - 15  Protein / creatinine ratio, urine     Status: None   Collection Time: 01/15/17  7:03 AM  Result Value Ref Range   Creatinine, Urine 62 mg/dL   Total Protein, Urine 6 mg/dL    Comment: NO NORMAL RANGE ESTABLISHED FOR THIS TEST   Protein Creatinine Ratio 0.10 0.00 - 0.15 mg/mg[Cre]  CBC with Differential/Platelet     Status:  Abnormal   Collection Time: 01/15/17 11:33 AM  Result Value Ref Range   WBC 13.9 (H) 3.6 - 11.0 K/uL   RBC 3.06 (L) 3.80 - 5.20 MIL/uL   Hemoglobin 7.5 (L) 12.0 - 16.0 g/dL    Comment: RESULT REPEATED AND VERIFIED   HCT 23.9 (L) 35.0 - 47.0 %   MCV 78.1 (L) 80.0 - 100.0 fL   MCH 24.5 (L) 26.0 - 34.0 pg   MCHC 31.4 (L) 32.0 - 36.0 g/dL   RDW 20.2 (H) 11.5 - 14.5 %   Platelets 198 150 - 440 K/uL   Neutrophils Relative % 83 %   Neutro Abs 11.5 (H) 1.4 - 6.5 K/uL   Lymphocytes Relative 13 %   Lymphs Abs 1.8 1.0 - 3.6 K/uL   Monocytes Relative 4 %   Monocytes Absolute 0.6 0.2 - 0.9 K/uL   Eosinophils Relative 0 %   Eosinophils Absolute 0.0 0 - 0.7 K/uL   Basophils Relative 0 %   Basophils Absolute 0.0 0 - 0.1 K/uL  Prepare RBC     Status: None   Collection Time: 01/15/17 12:30 PM  Result Value Ref Range   Order Confirmation ORDER PROCESSED BY BLOOD BANK   CBC with Differential/Platelet     Status: Abnormal   Collection Time: 01/15/17  3:35 PM  Result Value Ref Range   WBC 14.9 (H) 3.6 - 11.0 K/uL   RBC 3.37 (L) 3.80 - 5.20 MIL/uL   Hemoglobin 8.5 (L) 12.0 - 16.0  g/dL   HCT 26.3 (L) 35.0 - 47.0 %   MCV 78.0 (L) 80.0 - 100.0 fL   MCH 25.3 (L) 26.0 - 34.0 pg   MCHC 32.4 32.0 - 36.0 g/dL   RDW 19.3 (H) 11.5 - 14.5 %   Platelets 167 150 - 440 K/uL   Neutrophils Relative % 86 %   Neutro Abs 13.0 (H) 1.4 - 6.5 K/uL   Lymphocytes Relative 8 %   Lymphs Abs 1.2 1.0 - 3.6 K/uL   Monocytes Relative 5 %   Monocytes Absolute 0.7 0.2 - 0.9 K/uL   Eosinophils Relative 0 %   Eosinophils Absolute 0.0 0 - 0.7 K/uL   Basophils Relative 1 %   Basophils Absolute 0.1 0 - 0.1 K/uL  CBC     Status: Abnormal   Collection Time: 01/16/17  6:27 AM  Result Value Ref Range   WBC 9.2 3.6 - 11.0 K/uL   RBC 2.81 (L) 3.80 - 5.20 MIL/uL   Hemoglobin 7.3 (L) 12.0 - 16.0 g/dL   HCT 21.7 (L) 35.0 - 47.0 %   MCV 77.4 (L) 80.0 - 100.0 fL   MCH 26.0 26.0 - 34.0 pg   MCHC 33.6 32.0 - 36.0 g/dL   RDW 19.0 (H) 11.5 - 14.5 %   Platelets 137 (L) 150 - 440 K/uL   .res Assessment:   31 y.o. [id number] postoperative day # 1 RLTCS, postpartum hemorrhage  Plan:    1) Acute blood loss anemia - hemodynamically stable and asymptomatic - po ferrous sulfate - repeat CBC in AM  2) Blood Type --/--/O POS (08/20 1018) / Rubella 1.82 (03/21 0905) / Varicella Immune  3) TDAP status UTD 11/14/16  4) Bottle/Nexplanon  5) GHTN vs mild preeclampsia - elevated BP noted twice earlier in pregnancy.  Now with consistently mild range BP's.  CMP normal on admission.  Will recheck in AM with repeat CBC. - if severe range would administered 24-hr course of magnesium sulfate - no  BP meds unless severe range  6) Disposition anticipate POD3-4 given blood loss

## 2017-01-16 NOTE — Anesthesia Post-op Follow-up Note (Signed)
  Anesthesia Pain Follow-up Note  Patient: Lisa Richard  Day #: 1  Date of Follow-up: 01/16/2017 Time: 7:27 AM  Last Vitals:  Vitals:   01/16/17 0027 01/16/17 0414  BP: (!) 140/96 130/65  Pulse: 94 77  Resp: 18 18  Temp: 36.9 C 36.9 C  SpO2: 100% 100%    Level of Consciousness: alert  Pain: none   Side Effects:None  Catheter Site Exam:clean, dry     Plan: D/C from anesthesia care at surgeon's request  [facility]

## 2017-01-16 NOTE — Anesthesia Postprocedure Evaluation (Signed)
Anesthesia Post Note  Patient: Lisa Richard  Procedure(s) Performed: Procedure(s) (LRB): CESAREAN SECTION (N/A)  Patient location during evaluation: Mother Baby Anesthesia Type: Spinal Level of consciousness: awake, awake and alert and oriented Pain management: pain level controlled Vital Signs Assessment: post-procedure vital signs reviewed and stable Respiratory status: spontaneous breathing, nonlabored ventilation and respiratory function stable Cardiovascular status: blood pressure returned to baseline and stable Postop Assessment: no headache and no backache Anesthetic complications: no     Last Vitals:  Vitals:   01/16/17 0027 01/16/17 0414  BP: (!) 140/96 130/65  Pulse: 94 77  Resp: 18 18  Temp: 36.9 C 36.9 C  SpO2: 100% 100%    Last Pain:  Vitals:   01/16/17 0414  TempSrc: Oral  PainSc:                  Ginger Carne

## 2017-01-17 LAB — HEMOGLOBIN AND HEMATOCRIT, BLOOD
HCT: 24.4 % — ABNORMAL LOW (ref 35.0–47.0)
HEMOGLOBIN: 8 g/dL — AB (ref 12.0–16.0)

## 2017-01-17 LAB — CBC
HEMATOCRIT: 20.6 % — AB (ref 35.0–47.0)
Hemoglobin: 6.9 g/dL — ABNORMAL LOW (ref 12.0–16.0)
MCH: 26.1 pg (ref 26.0–34.0)
MCHC: 33.5 g/dL (ref 32.0–36.0)
MCV: 78.1 fL — ABNORMAL LOW (ref 80.0–100.0)
Platelets: 148 10*3/uL — ABNORMAL LOW (ref 150–440)
RBC: 2.64 MIL/uL — AB (ref 3.80–5.20)
RDW: 19.5 % — ABNORMAL HIGH (ref 11.5–14.5)
WBC: 8.1 10*3/uL (ref 3.6–11.0)

## 2017-01-17 LAB — COMPREHENSIVE METABOLIC PANEL
ALBUMIN: 2.5 g/dL — AB (ref 3.5–5.0)
ALK PHOS: 56 U/L (ref 38–126)
ALT: 9 U/L — ABNORMAL LOW (ref 14–54)
ANION GAP: 5 (ref 5–15)
AST: 19 U/L (ref 15–41)
BILIRUBIN TOTAL: 0.5 mg/dL (ref 0.3–1.2)
BUN: 10 mg/dL (ref 6–20)
CO2: 26 mmol/L (ref 22–32)
Calcium: 8.6 mg/dL — ABNORMAL LOW (ref 8.9–10.3)
Chloride: 105 mmol/L (ref 101–111)
Creatinine, Ser: 0.83 mg/dL (ref 0.44–1.00)
GFR calc Af Amer: >60 mL/min (ref >60)
GFR calc non Af Amer: >60 mL/min (ref >60)
GLUCOSE: 94 mg/dL (ref 65–99)
POTASSIUM: 3.7 mmol/L (ref 3.5–5.1)
SODIUM: 136 mmol/L (ref 135–145)
Total Protein: 5.6 g/dL — ABNORMAL LOW (ref 6.5–8.1)

## 2017-01-17 LAB — GLUCOSE, CAPILLARY: Glucose-Capillary: 89 mg/dL (ref 65–99)

## 2017-01-17 LAB — PREPARE RBC (CROSSMATCH)

## 2017-01-17 MED ORDER — DIPHENHYDRAMINE HCL 25 MG PO CAPS
25.0000 mg | ORAL_CAPSULE | Freq: Once | ORAL | Status: AC
  Administered 2017-01-17: 25 mg via ORAL
  Filled 2017-01-17: qty 1

## 2017-01-17 MED ORDER — LABETALOL HCL 100 MG PO TABS
100.0000 mg | ORAL_TABLET | Freq: Two times a day (BID) | ORAL | Status: DC
Start: 2017-01-17 — End: 2017-01-18
  Administered 2017-01-17 (×2): 100 mg via ORAL
  Filled 2017-01-17 (×2): qty 1

## 2017-01-17 MED ORDER — DOCUSATE SODIUM 100 MG PO CAPS
100.0000 mg | ORAL_CAPSULE | Freq: Every day | ORAL | Status: DC
  Administered 2017-01-17 – 2017-01-18 (×2): 100 mg via ORAL
  Filled 2017-01-17 (×2): qty 1

## 2017-01-17 MED ORDER — FERROUS SULFATE 325 (65 FE) MG PO TABS
325.0000 mg | ORAL_TABLET | Freq: Two times a day (BID) | ORAL | Status: DC
  Administered 2017-01-17 – 2017-01-18 (×3): 325 mg via ORAL
  Filled 2017-01-17 (×3): qty 1

## 2017-01-17 MED ORDER — SODIUM CHLORIDE 0.9 % IV SOLN
Freq: Once | INTRAVENOUS | Status: AC
  Administered 2017-01-17: 12:00:00 via INTRAVENOUS

## 2017-01-17 MED ORDER — VITAMIN C 500 MG PO TABS
500.0000 mg | ORAL_TABLET | Freq: Two times a day (BID) | ORAL | Status: DC
Start: 2017-01-17 — End: 2017-01-18
  Administered 2017-01-17 – 2017-01-18 (×2): 500 mg via ORAL
  Filled 2017-01-17 (×2): qty 1

## 2017-01-17 NOTE — Progress Notes (Signed)
POD #2 repeat Cesarean section and PPH. Received one unit of blood Subjective:  Tolerating regular diet. Passing flatus. Not lightheaded. Bottlefeeding. Upset this Am-regarding daycare for older child.  Objective:  Blood pressure (!) 158/88, pulse 84, temperature 98 F (36.7 C), temperature source Oral, resp. rate 18, height 5' 1" (1.549 m), weight 99.8 kg (220 lb), last menstrual period 04/06/2016, SpO2 100 %, unknown if currently breastfeeding. BP range 134/82 to 158/88 General: BF in NAD Pulmonary: no increased work of breathing/ CTAB Heart: RRR with Grade II/VI systolic murmur at LSB Abdomen: non-distended, non-tender, BS active Lochia: moderate  Incision: honeycomb dressing intact, ON Q intact Extremities: no edema, no erythema, no tenderness  Results for orders placed or performed during the hospital encounter of 01/15/17 (from the past 72 hour(s))  Glucose, capillary     Status: None   Collection Time: 01/15/17  6:27 AM  Result Value Ref Range   Glucose-Capillary 86 65 - 99 mg/dL  CBC     Status: Abnormal   Collection Time: 01/15/17  7:03 AM  Result Value Ref Range   WBC 6.5 3.6 - 11.0 K/uL   RBC 4.17 3.80 - 5.20 MIL/uL   Hemoglobin 10.5 (L) 12.0 - 16.0 g/dL   HCT 31.5 (L) 35.0 - 47.0 %   MCV 75.6 (L) 80.0 - 100.0 fL   MCH 25.2 (L) 26.0 - 34.0 pg   MCHC 33.3 32.0 - 36.0 g/dL   RDW 20.5 (H) 11.5 - 14.5 %   Platelets 171 150 - 440 K/uL  Comprehensive metabolic panel     Status: Abnormal   Collection Time: 01/15/17  7:03 AM  Result Value Ref Range   Sodium 133 (L) 135 - 145 mmol/L   Potassium 4.0 3.5 - 5.1 mmol/L   Chloride 106 101 - 111 mmol/L   CO2 21 (L) 22 - 32 mmol/L   Glucose, Bld 84 65 - 99 mg/dL   BUN 6 6 - 20 mg/dL   Creatinine, Ser 0.57 0.44 - 1.00 mg/dL   Calcium 8.6 (L) 8.9 - 10.3 mg/dL   Total Protein 6.7 6.5 - 8.1 g/dL   Albumin 2.9 (L) 3.5 - 5.0 g/dL   AST 27 15 - 41 U/L   ALT 10 (L) 14 - 54 U/L   Alkaline Phosphatase 82 38 - 126 U/L   Total  Bilirubin 0.9 0.3 - 1.2 mg/dL   GFR calc non Af Amer >60 >60 mL/min   GFR calc Af Amer >60 >60 mL/min    Comment: (NOTE) The eGFR has been calculated using the CKD EPI equation. This calculation has not been validated in all clinical situations. eGFR's persistently <60 mL/min signify possible Chronic Kidney Disease.    Anion gap 6 5 - 15  Protein / creatinine ratio, urine     Status: None   Collection Time: 01/15/17  7:03 AM  Result Value Ref Range   Creatinine, Urine 62 mg/dL   Total Protein, Urine 6 mg/dL    Comment: NO NORMAL RANGE ESTABLISHED FOR THIS TEST   Protein Creatinine Ratio 0.10 0.00 - 0.15 mg/mg[Cre]  CBC with Differential/Platelet     Status: Abnormal   Collection Time: 01/15/17 11:33 AM  Result Value Ref Range   WBC 13.9 (H) 3.6 - 11.0 K/uL   RBC 3.06 (L) 3.80 - 5.20 MIL/uL   Hemoglobin 7.5 (L) 12.0 - 16.0 g/dL    Comment: RESULT REPEATED AND VERIFIED   HCT 23.9 (L) 35.0 - 47.0 %   MCV 78.1 (  L) 80.0 - 100.0 fL   MCH 24.5 (L) 26.0 - 34.0 pg   MCHC 31.4 (L) 32.0 - 36.0 g/dL   RDW 20.2 (H) 11.5 - 14.5 %   Platelets 198 150 - 440 K/uL   Neutrophils Relative % 83 %   Neutro Abs 11.5 (H) 1.4 - 6.5 K/uL   Lymphocytes Relative 13 %   Lymphs Abs 1.8 1.0 - 3.6 K/uL   Monocytes Relative 4 %   Monocytes Absolute 0.6 0.2 - 0.9 K/uL   Eosinophils Relative 0 %   Eosinophils Absolute 0.0 0 - 0.7 K/uL   Basophils Relative 0 %   Basophils Absolute 0.0 0 - 0.1 K/uL  Prepare RBC     Status: None   Collection Time: 01/15/17 12:30 PM  Result Value Ref Range   Order Confirmation ORDER PROCESSED BY BLOOD BANK   CBC with Differential/Platelet     Status: Abnormal   Collection Time: 01/15/17  3:35 PM  Result Value Ref Range   WBC 14.9 (H) 3.6 - 11.0 K/uL   RBC 3.37 (L) 3.80 - 5.20 MIL/uL   Hemoglobin 8.5 (L) 12.0 - 16.0 g/dL   HCT 26.3 (L) 35.0 - 47.0 %   MCV 78.0 (L) 80.0 - 100.0 fL   MCH 25.3 (L) 26.0 - 34.0 pg   MCHC 32.4 32.0 - 36.0 g/dL   RDW 19.3 (H) 11.5 - 14.5 %    Platelets 167 150 - 440 K/uL   Neutrophils Relative % 86 %   Neutro Abs 13.0 (H) 1.4 - 6.5 K/uL   Lymphocytes Relative 8 %   Lymphs Abs 1.2 1.0 - 3.6 K/uL   Monocytes Relative 5 %   Monocytes Absolute 0.7 0.2 - 0.9 K/uL   Eosinophils Relative 0 %   Eosinophils Absolute 0.0 0 - 0.7 K/uL   Basophils Relative 1 %   Basophils Absolute 0.1 0 - 0.1 K/uL  CBC     Status: Abnormal   Collection Time: 01/16/17  6:27 AM  Result Value Ref Range   WBC 9.2 3.6 - 11.0 K/uL   RBC 2.81 (L) 3.80 - 5.20 MIL/uL   Hemoglobin 7.3 (L) 12.0 - 16.0 g/dL   HCT 21.7 (L) 35.0 - 47.0 %   MCV 77.4 (L) 80.0 - 100.0 fL   MCH 26.0 26.0 - 34.0 pg   MCHC 33.6 32.0 - 36.0 g/dL   RDW 19.0 (H) 11.5 - 14.5 %   Platelets 137 (L) 150 - 440 K/uL  CBC     Status: Abnormal   Collection Time: 01/17/17  5:20 AM  Result Value Ref Range   WBC 8.1 3.6 - 11.0 K/uL   RBC 2.64 (L) 3.80 - 5.20 MIL/uL   Hemoglobin 6.9 (L) 12.0 - 16.0 g/dL   HCT 20.6 (L) 35.0 - 47.0 %   MCV 78.1 (L) 80.0 - 100.0 fL   MCH 26.1 26.0 - 34.0 pg   MCHC 33.5 32.0 - 36.0 g/dL   RDW 19.5 (H) 11.5 - 14.5 %   Platelets 148 (L) 150 - 440 K/uL  Comprehensive metabolic panel     Status: Abnormal   Collection Time: 01/17/17  5:20 AM  Result Value Ref Range   Sodium 136 135 - 145 mmol/L   Potassium 3.7 3.5 - 5.1 mmol/L   Chloride 105 101 - 111 mmol/L   CO2 26 22 - 32 mmol/L   Glucose, Bld 94 65 - 99 mg/dL   BUN 10 6 - 20 mg/dL  Creatinine, Ser 0.83 0.44 - 1.00 mg/dL   Calcium 8.6 (L) 8.9 - 10.3 mg/dL   Total Protein 5.6 (L) 6.5 - 8.1 g/dL   Albumin 2.5 (L) 3.5 - 5.0 g/dL   AST 19 15 - 41 U/L   ALT 9 (L) 14 - 54 U/L   Alkaline Phosphatase 56 38 - 126 U/L   Total Bilirubin 0.5 0.3 - 1.2 mg/dL   GFR calc non Af Amer >60 >60 mL/min   GFR calc Af Amer >60 >60 mL/min    Comment: (NOTE) The eGFR has been calculated using the CKD EPI equation. This calculation has not been validated in all clinical situations. eGFR's persistently <60 mL/min signify  possible Chronic Kidney Disease.    Anion gap 5 5 - 15  Glucose, capillary     Status: None   Collection Time: 01/17/17  6:15 AM  Result Value Ref Range   Glucose-Capillary 89 65 - 99 mg/dL  Prepare RBC     Status: None   Collection Time: 01/17/17 11:00 AM  Result Value Ref Range   Order Confirmation ORDER PROCESSED BY BLOOD BANK      Assessment:   31 y.o. [id number] postoperativeday # 2  Gestational hypertension   Normal to mild range blood pressures   Start labetalol 100 mgm BID   Plan:  1) Acute blood loss anemia - hemodynamically stable and asymptomatic, but has no reserve in case of further bleeding. Anemia will make doing ADL much more difficult after discharge -transfuse another unit of blood - po ferrous sulfate/ vitamins  2O POS/ RI/ VI  3) TDAP UTD/ 11/14/2016   4) Bottle/ ?Nexplanon  5) Disposition-on POD 3 or 4

## 2017-01-18 LAB — GLUCOSE, CAPILLARY: Glucose-Capillary: 113 mg/dL — ABNORMAL HIGH (ref 65–99)

## 2017-01-18 MED ORDER — IBUPROFEN 600 MG PO TABS: 600.0000 mg | ORAL_TABLET | Freq: Four times a day (QID) | ORAL | 0 refills | Status: DC | PRN

## 2017-01-18 MED ORDER — OXYCODONE-ACETAMINOPHEN 5-325 MG PO TABS: 1.0000 | ORAL_TABLET | ORAL | 0 refills | Status: DC | PRN

## 2017-01-18 MED ORDER — LABETALOL HCL 200 MG PO TABS
200.0000 mg | ORAL_TABLET | Freq: Two times a day (BID) | ORAL | Status: DC
  Administered 2017-01-18: 200 mg via ORAL
  Filled 2017-01-18: qty 1

## 2017-01-18 MED ORDER — LABETALOL HCL 200 MG PO TABS: 200.0000 mg | ORAL_TABLET | Freq: Two times a day (BID) | ORAL | 1 refills | Status: DC

## 2017-01-18 NOTE — Discharge Instructions (Signed)
Please call your doctor or return to the ER if you experience any chest pains, shortness of breath, dizziness, visual changes, fever greater than 101, any heavy bleeding (saturating more than 1 pad per hour), large clots, or foul smelling discharge, any worsening abdominal pain and cramping that is not controlled by pain medication, or any signs of postpartum depression. No tampons, enemas, douches, or sexual intercourse for 6 weeks. Also avoid tub baths, hot tubs, or swimming for 6 weeks.   Check your incision daily for any signs of infection such as redness, warmth, swelling, increased pain, pus or foul smelling drainage  Activity: do not lift over 10 lbs for 6 weeks No driving for 1-2 weeks Pelvic rest for 6 weeks    Cesarean Delivery, Care After Refer to this sheet in the next few weeks. These instructions provide you with information about caring for yourself after your procedure. Your health care provider may also give you more specific instructions. Your treatment has been planned according to current medical practices, but problems sometimes occur. Call your health care provider if you have any problems or questions after your procedure. What can I expect after the procedure? After the procedure, it is common to have:  A small amount of blood or clear fluid coming from the incision.  Some redness, swelling, and pain in your incision area.  Some abdominal pain and soreness.  Vaginal bleeding (lochia).  Pelvic cramps.  Fatigue.  Follow these instructions at home: Incision care   Follow instructions from your health care provider about how to take care of your incision. Make sure you: ? Wash your hands with soap and water before you change your bandage (dressing). If soap and water are not available, use hand sanitizer. ? Change your dressing as told by your health care provider. ? Leave stitches (sutures), skin staples, skin glue, or adhesive strips in place. These skin  closures may need to stay in place for 2 weeks or longer. If adhesive strip edges start to loosen and curl up, you may trim the loose edges. Do not remove adhesive strips completely unless your health care provider tells you to do that.  Check your incision area every day for signs of infection. Check for: ? More redness, swelling, or pain. ? More fluid or blood. ? Warmth. ? Pus or a bad smell.  When you cough or sneeze, hug a pillow. This helps with pain and decreases the chance of your incision opening up (dehiscing). Do this until your incision heals. Medicines  Take over-the-counter and prescription medicines only as told by your health care provider.  If you were prescribed an antibiotic medicine, take it as told by your health care provider. Do not stop taking the antibiotic until it is finished. Driving  Do not drive or operate heavy machinery while taking prescription pain medicine.  Do not drive for 24 hours if you received a sedative. Lifestyle  Do not drink alcohol. This is especially important if you are breastfeeding or taking pain medicine.  Do not use tobacco products, including cigarettes, chewing tobacco, or e-cigarettes. If you need help quitting, ask your health care provider. Tobacco can delay wound healing. Eating and drinking  Drink at least 8 eight-ounce glasses of water every day unless told not to by your health care provider. If you breastfeed, you may need to drink more water than this.  Eat high-fiber foods every day. These foods may help prevent or relieve constipation. High-fiber foods include: ? Whole grain cereals  and breads. ? Brown rice. ? Beans. ? Fresh fruits and vegetables. Activity  Return to your normal activities as told by your health care provider. Ask your health care provider what activities are safe for you.  Rest as much as possible. Try to rest or take a nap while your baby is sleeping.  Do not lift anything that is heavier than  your baby or 10 lb (4.5 kg) as told by your health care provider.  Ask your health care provider when you can engage in sexual activity. This may depend on your: ? Risk of infection. ? Healing rate. ? Comfort and desire to engage in sexual activity. Bathing  Do not take baths, swim, or use a hot tub until your health care provider approves. Ask your health care provider if you can take showers. You may only be allowed to take sponge baths until your incision heals.  Keep your dressing dry as told by your health care provider. General instructions  Do not use tampons or douches until your health care provider approves.  Wear: ? Loose, comfortable clothing. ? A supportive and well-fitting bra.  Watch for any blood clots that may pass from your vagina. These may look like clumps of dark red, brown, or black discharge.  Keep your perineum clean and dry as told by your health care provider.  Wipe from front to back when you use the toilet.  If possible, have someone help you care for your baby and help with household activities for a few days after you leave the hospital.  Keep all follow-up visits for you and your baby as told by your health care provider. This is important. Contact a health care provider if:  You have: ? Bad-smelling vaginal discharge. ? Difficulty urinating. ? Pain when urinating. ? A sudden increase or decrease in the frequency of your bowel movements. ? More redness, swelling, or pain around your incision. ? More fluid or blood coming from your incision. ? Pus or a bad smell coming from your incision. ? A fever. ? A rash. ? Little or no interest in activities you used to enjoy. ? Questions about caring for yourself or your baby. ? Nausea.  Your incision feels warm to the touch.  Your breasts turn red or become painful or hard.  You feel unusually sad or worried.  You vomit.  You pass large blood clots from your vagina. If you pass a blood clot,  save it to show to your health care provider. Do not flush blood clots down the toilet without showing your health care provider.  You urinate more than usual.  You are dizzy or light-headed.  You have not breastfed and have not had a menstrual period for 12 weeks after delivery.  You stopped breastfeeding and have not had a menstrual period for 12 weeks after stopping breastfeeding. Get help right away if:  You have: ? Pain that does not go away or get better with medicine. ? Chest pain. ? Difficulty breathing. ? Blurred vision or spots in your vision. ? Thoughts about hurting yourself or your baby. ? New pain in your abdomen or in one of your legs. ? A severe headache.  You faint.  You bleed from your vagina so much that you fill two sanitary pads in one hour. This information is not intended to replace advice given to you by your health care provider. Make sure you discuss any questions you have with your health care provider. Document Released: 02/03/2002 Document  Revised: 09/22/2015 Document Reviewed: 04/18/2015 Elsevier Interactive Patient Education  2017 [employer].

## 2017-01-18 NOTE — Progress Notes (Signed)
Discharge order received from doctor. Reviewed discharge instructions and prescriptions with patient and answered all questions. Follow up appointment given. Incision cleaning kit given. Patient verbalized understanding. ID bands checked. Patient discharged home with infant via wheelchair by nursing/auxillary.    Markisha Meding Garner, RN  

## 2017-01-22 LAB — BPAM RBC
BLOOD PRODUCT EXPIRATION DATE: 201809092359
BLOOD PRODUCT EXPIRATION DATE: 201809122359
BLOOD PRODUCT EXPIRATION DATE: 201809132359
ISSUE DATE / TIME: 201808211232
ISSUE DATE / TIME: 201808231142
ISSUE DATE / TIME: 201808241029
UNIT TYPE AND RH: 5100
Unit Type and Rh: 5100
Unit Type and Rh: 5100

## 2017-01-22 LAB — TYPE AND SCREEN
ABO/RH(D): O POS
ANTIBODY SCREEN: POSITIVE
Donor AG Type: NEGATIVE
Donor AG Type: NEGATIVE
Extend sample reason: UNDETERMINED
UNIT DIVISION: 0
UNIT DIVISION: 0
UNIT DIVISION: 0

## 2017-01-25 ENCOUNTER — Encounter: Payer: Self-pay | Admitting: Maternal Newborn

## 2017-01-25 ENCOUNTER — Ambulatory Visit (INDEPENDENT_AMBULATORY_CARE_PROVIDER_SITE_OTHER): Payer: Medicaid Other | Admitting: Maternal Newborn

## 2017-01-25 NOTE — Progress Notes (Signed)
Dressing removed from incision. Incision site is clean, dry, no drainage, no signs of infection or dehiscence. Advised patient of signs/sx of postpartum preeclampsia and to check BP and return to care as needed. Post-op appointment next week.

## 2017-01-29 ENCOUNTER — Ambulatory Visit (INDEPENDENT_AMBULATORY_CARE_PROVIDER_SITE_OTHER): Payer: Medicaid Other | Admitting: Obstetrics & Gynecology

## 2017-01-29 ENCOUNTER — Encounter: Payer: Self-pay | Admitting: Obstetrics & Gynecology

## 2017-01-29 ENCOUNTER — Ambulatory Visit: Payer: Medicaid Other | Admitting: Obstetrics & Gynecology

## 2017-01-29 VITALS — BP 140/90 | HR 104 | Ht 61.0 in | Wt 199.0 lb

## 2017-01-29 DIAGNOSIS — Z8759 Personal history of other complications of pregnancy, childbirth and the puerperium: Secondary | ICD-10-CM | POA: Diagnosis not present

## 2017-01-29 DIAGNOSIS — O24415 Gestational diabetes mellitus in pregnancy, controlled by oral hypoglycemic drugs: Secondary | ICD-10-CM

## 2017-01-29 DIAGNOSIS — Z98891 History of uterine scar from previous surgery: Secondary | ICD-10-CM

## 2017-01-29 NOTE — Progress Notes (Signed)
  Hypertension Follow-up Patient presents with continues use of LABETALOL for hypertension following her delivery by CS 2 weeks ago.  Denies headache, blurry vision, CP, SOB, epigastric pain, or edema.  Also denies nausea or dizziness. Reports incision healing well.  Objective: BP 140/90   Pulse (!) 104   Ht 5\' 1"  (1.549 m)   Wt 199 lb (90.3 kg)   BMI 37.60 kg/m  Physical Exam  Constitutional: She is oriented to person, place, and time. She appears well-developed and well-nourished. No distress.  Cardiovascular: Normal rate.   Pulmonary/Chest: Effort normal.  Abdominal: Soft. She exhibits no distension. There is no tenderness.  Incision Healing Well   Musculoskeletal: Normal range of motion.  Neurological: She is alert and oriented to person, place, and time. No cranial nerve deficit.  Skin: Skin is warm and dry.  Psychiatric: She has a normal mood and affect.   Assessment: 1. Gest HTN with lingering HTN 2. History of gestational DM 3. Multiple Cesarean sections  Plan: 1. Cont Labetalol for now.  May taper off later once numbers normalize.  Still borderline today but overall patient improving without sx's. 2. 2 hour gluolca at 6 weeks 3. Wound care discussed.  Inc healing well.  Precautions still discussed. 4. F/u for BP check one week 5. [facility]  Letitia LibraRobert Paul Harris 01/29/2017, 8:19 AM

## 2017-01-30 ENCOUNTER — Ambulatory Visit: Payer: Medicaid Other | Admitting: Obstetrics & Gynecology

## 2017-01-31 ENCOUNTER — Telehealth: Payer: Self-pay

## 2017-01-31 NOTE — Telephone Encounter (Signed)
OK to provide note w CS date and return to work 6 weeks later

## 2017-01-31 NOTE — Telephone Encounter (Signed)
Pt calling requesting letter stating when her C-Section was and that she is still currently out of work. [facility]

## 2017-02-01 NOTE — Telephone Encounter (Signed)
Pt aware via vm it is up front at office

## 2017-02-07 ENCOUNTER — Ambulatory Visit (INDEPENDENT_AMBULATORY_CARE_PROVIDER_SITE_OTHER): Payer: Medicaid Other | Admitting: Obstetrics & Gynecology

## 2017-02-07 ENCOUNTER — Encounter: Payer: Self-pay | Admitting: Obstetrics & Gynecology

## 2017-02-07 VITALS — BP 130/80 | HR 91 | Ht 61.0 in | Wt 201.0 lb

## 2017-02-07 DIAGNOSIS — O165 Unspecified maternal hypertension, complicating the puerperium: Secondary | ICD-10-CM | POA: Diagnosis not present

## 2017-02-07 DIAGNOSIS — I10 Essential (primary) hypertension: Secondary | ICD-10-CM | POA: Insufficient documentation

## 2017-02-07 NOTE — Progress Notes (Signed)
Subjective:  MIRTA MALLY is a 31 y.o. female with hypertension.  She has had HTN since delivery.  She has tapered off of LABETALOL.  Denies headache, blurry vision, epigastric pain, CP, SOB, or edema. She had GDM but is not having sx's or checking BS now. Infant doing well. PMHx: She  has a past medical history of Diabetes mellitus without complication ([place]) and Obesity. Also,  has a past surgical history that includes Cesarean section; Cesarean section (N/A, 02/20/2016); Dilation and curettage of uterus (2009); and Cesarean section (N/A, 01/15/2017)., family history includes Prostate cancer in her father.,  reports that she quit smoking about 7 months ago. She has never used smokeless tobacco. She reports that she does not drink alcohol or use drugs. She has No Known Allergies.. Review of Systems  All other systems reviewed and are negative.  Current Outpatient Prescriptions  Medication Sig Dispense Refill  . Blood Glucose Monitoring Suppl (ONE TOUCH ULTRA 2) w/Device KIT 1 kit by Does not apply route as directed. 1 each 8  . glucose blood (ONE TOUCH ULTRA TEST) test strip Use as directed 60 each 5  . ibuprofen (ADVIL,MOTRIN) 600 MG tablet Take 1 tablet (600 mg total) by mouth every 6 (six) hours as needed for fever or moderate pain. 30 tablet 0  . labetalol (NORMODYNE) 200 MG tablet Take 1 tablet (200 mg total) by mouth 2 (two) times daily. 60 tablet 1  . Lancets (ONETOUCH ULTRASOFT) lancets Use as instructed 100 each 12  . oxyCODONE-acetaminophen (PERCOCET/ROXICET) 5-325 MG tablet Take 1 tablet by mouth every 4 (four) hours as needed for severe pain (pain scale 4-7). 24 tablet 0  . Prenat w/o A-FeCbGl-DSS-FA-DHA (CITRANATAL BLOOM DHA) 90-1 & 300 MG MISC Take 1 tablet by mouth daily. (Patient not taking: Reported on 01/04/2017) 30 each 11   Objective:  BP 130/80   Pulse 91   Ht _0  (1.549 m)   Wt 201 lb (91.2 kg)   BMI 37.98 kg/m   Appearance alert, well appearing, and in no  distress. General exam BP noted to be well controlled today in office, S1, S2 normal, no gallop, no murmur, chest clear, no JVD, no HSM, no edema.  Incision- c/d/i Lab review: no lab studies available for review at time of visit.   Assessment:   Hypertension improved.   Plan:  Current treatment plan is effective, no change in therapy. Specific diabetic recommendations: low cholesterol diet, weight control and daily exercise discussed..  Plan PP visit No BC desired (abstinance)  Barnett Applebaum, MD, [facility], [facility] 02/07/2017  3:57 PM

## 2017-02-08 ENCOUNTER — Ambulatory Visit: Payer: Medicaid Other | Admitting: Obstetrics & Gynecology

## 2017-02-28 ENCOUNTER — Ambulatory Visit: Payer: Medicaid Other | Admitting: Obstetrics & Gynecology

## 2017-04-01 NOTE — Progress Notes (Deleted)
[place]  Telephone:([phone]) [phone] [phone]  ID: Lisa Richard OB: 05-07-1986  MR#: [id number]  XVQ#:[id number]  Patient Care Team: Patient, No Pcp Per as PCP - General (General Practice)  CHIEF COMPLAINT: Anemia affecting pregnancy in the third trimester.  INTERVAL HISTORY: Patient returns to clinic today for repeat laboratory work and further evaluation. She continues to feel well and is asymptomatic. She has no neurologic complaints. She is gaining weight appropriately. She denies any recent fevers or illnesses. She denies any chest pain or shortness of breath. She has no nausea, vomiting, constipation, or diarrhea. She has no urinary complaints. Patient feels at her baseline and offers no specific complaints today.  REVIEW OF SYSTEMS:   Review of Systems  Constitutional: Negative.  Negative for fever, malaise/fatigue and weight loss.  Respiratory: Negative.  Negative for cough and shortness of breath.   Cardiovascular: Negative.  Negative for chest pain and leg swelling.  Gastrointestinal: Negative.  Negative for abdominal pain.  Genitourinary: Negative.   Skin: Negative.  Negative for rash.  Neurological: Negative.  Negative for weakness.  Psychiatric/Behavioral: Negative.  The patient is not nervous/anxious.     As per HPI. Otherwise, a complete review of systems is negative.  PAST MEDICAL HISTORY: Past Medical History:  Diagnosis Date  . Diabetes mellitus without complication ([place])   . Obesity     PAST SURGICAL HISTORY: Past Surgical History:  Procedure Laterality Date  . CESAREAN SECTION      x 2  . DILATION AND CURETTAGE OF UTERUS  2009   Missed AB    FAMILY HISTORY: Family History  Problem Relation Age of Onset  . Prostate cancer Father     ADVANCED DIRECTIVES (Y/N):  N  HEALTH MAINTENANCE: Social History   Tobacco Use  . Smoking status: Former Smoker    Last attempt to quit: 06/16/2016    Years since quitting: 0.7    . Smokeless tobacco: Never Used  Substance Use Topics  . Alcohol use: No  . Drug use: No     Colonoscopy:  PAP:  Bone density:  Lipid panel:  No Known Allergies  Current Outpatient Medications  Medication Sig Dispense Refill  . Blood Glucose Monitoring Suppl (ONE TOUCH ULTRA 2) w/Device KIT 1 kit by Does not apply route as directed. 1 each 8  . glucose blood (ONE TOUCH ULTRA TEST) test strip Use as directed 60 each 5  . ibuprofen (ADVIL,MOTRIN) 600 MG tablet Take 1 tablet (600 mg total) by mouth every 6 (six) hours as needed for fever or moderate pain. 30 tablet 0  . Lancets (ONETOUCH ULTRASOFT) lancets Use as instructed 100 each 12  . Prenat w/o A-FeCbGl-DSS-FA-DHA (CITRANATAL BLOOM DHA) 90-1 & 300 MG MISC Take 1 tablet by mouth daily. (Patient not taking: Reported on 01/04/2017) 30 each 11   No current facility-administered medications for this visit.     OBJECTIVE: There were no vitals filed for this visit.   There is no height or weight on file to calculate BMI.    ECOG FS:0 - Asymptomatic  General: Well-developed, well-nourished, no acute distress. Eyes: Pink conjunctiva, anicteric sclera. Lungs: Clear to auscultation bilaterally. Heart: Regular rate and rhythm. No rubs, murmurs, or gallops. Abdomen: Appears appropriate for gestational age. Musculoskeletal: No edema, cyanosis, or clubbing. Neuro: Alert, answering all questions appropriately. Cranial nerves grossly intact. Skin: No rashes or petechiae noted. Psych: Normal affect.   LAB RESULTS:  Lab Results  Component Value Date   NA 136 01/17/2017  K 3.7 01/17/2017   CL 105 01/17/2017   CO2 26 01/17/2017   GLUCOSE 94 01/17/2017   BUN 10 01/17/2017   CREATININE 0.83 01/17/2017   CALCIUM 8.6 (L) 01/17/2017   PROT 5.6 (L) 01/17/2017   ALBUMIN 2.5 (L) 01/17/2017   AST 19 01/17/2017   ALT 9 (L) 01/17/2017   ALKPHOS 56 01/17/2017   BILITOT 0.5 01/17/2017   GFRNONAA >60 01/17/2017   GFRAA >60 01/17/2017     Lab Results  Component Value Date   WBC 8.1 01/17/2017   NEUTROABS 13.0 (H) 01/15/2017   HGB 8.0 (L) 01/17/2017   HCT 24.4 (L) 01/17/2017   MCV 78.1 (L) 01/17/2017   PLT 148 (L) 01/17/2017   Lab Results  Component Value Date   IRON 46 01/02/2017   TIBC 438 01/02/2017   IRONPCTSAT 11 01/02/2017   Lab Results  Component Value Date   FERRITIN 20 01/02/2017     STUDIES: No results found.  ASSESSMENT: Anemia affecting pregnancy in the third trimester  PLAN:    1. Anemia affecting pregnancy in the third trimester: Patient's hemoglobin has significantly improved and is now greater than 10.0. Her iron stores are now within normal limits. She does not require additional IV Feraheme today. Return to clinic in 3 months for repeat laboratory work and further evaluation. If her hemoglobin continues to improve, she likely can be discharged from clinic at that time. 2. Pregnancy: Patient is having a scheduled C-section on approximately January 15, 2017. Continue monitoring per OB/GYN. Follow-up as above. 3. Antibodies: Patient had type and screen drawn today for further evaluation of her known antibodies. Blood Bank aware in case patient needs transfusion at this time for C-section.  Approximately 30 minutes spent in discussion of which greater than 50% was consultation.  Patient expressed understanding and was in agreement with this plan. She also understands that She can call clinic at any time with any questions, concerns, or complaints.    Lloyd Huger, MD   04/01/2017 1:48 PM

## 2017-04-02 ENCOUNTER — Other Ambulatory Visit: Payer: Self-pay | Admitting: *Deleted

## 2017-04-02 DIAGNOSIS — D649 Anemia, unspecified: Secondary | ICD-10-CM

## 2017-04-02 IMAGING — US US OB LIMITED
1 series · 14 of 28 positions shown · non-contrast
Comparison: none

CLINICAL DATA: Generalized abdominal pain for 4 days

EXAM:
LIMITED OBSTETRIC ULTRASOUND

[Series 1: us ob limited · 0.23mm/px · 14 of 34 slices shown]
[im 2/34]
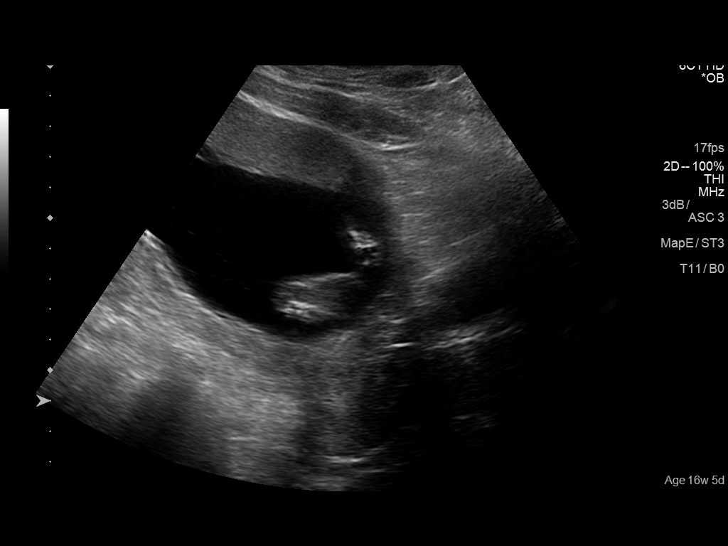
[im 4/34]
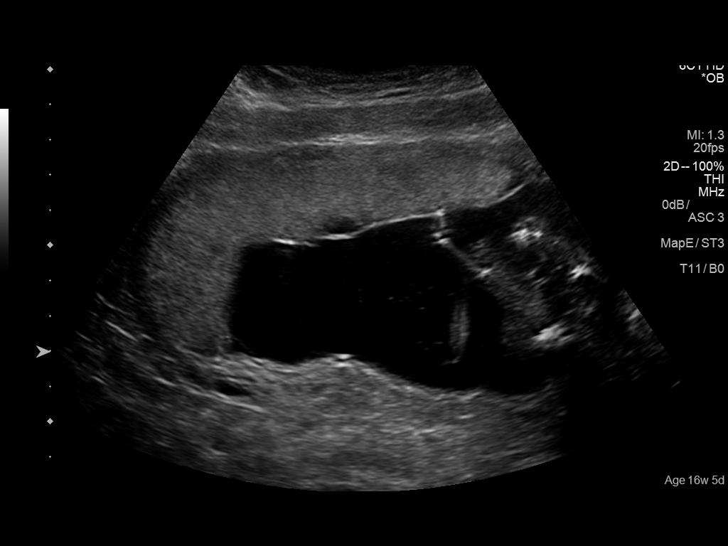
[im 7/34]
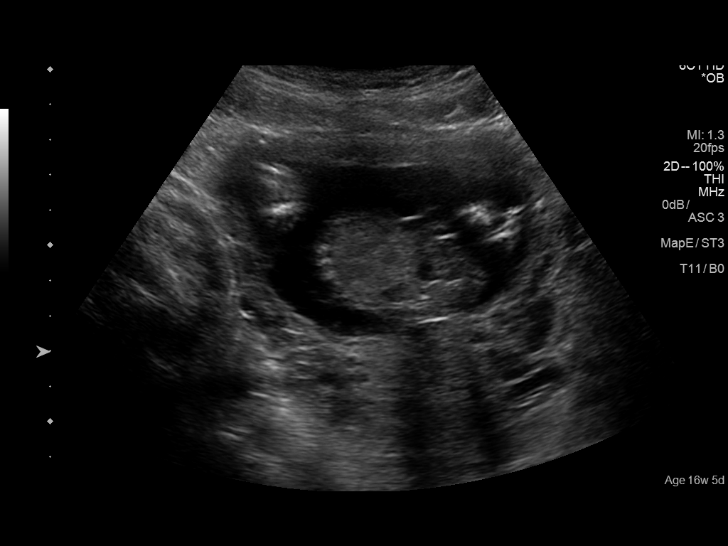
[im 9/34]
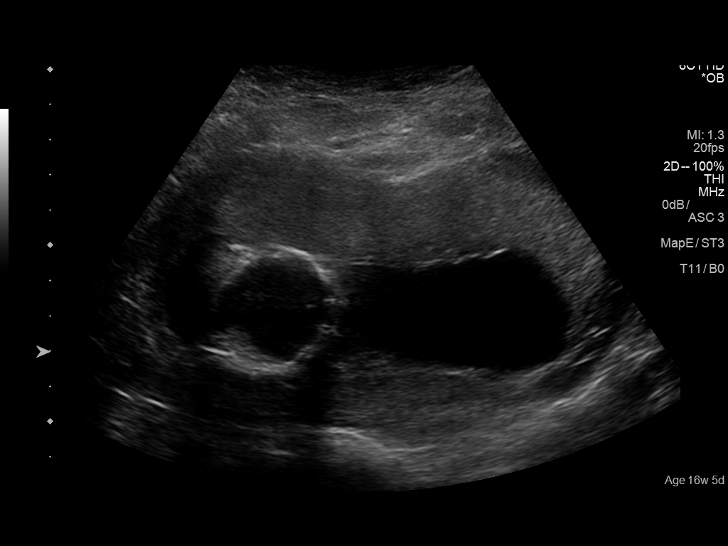
[im 12/34]
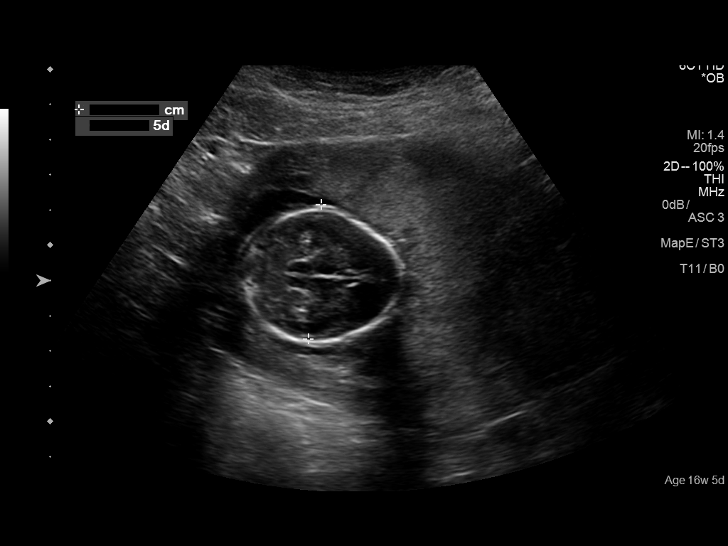
[im 14/34]
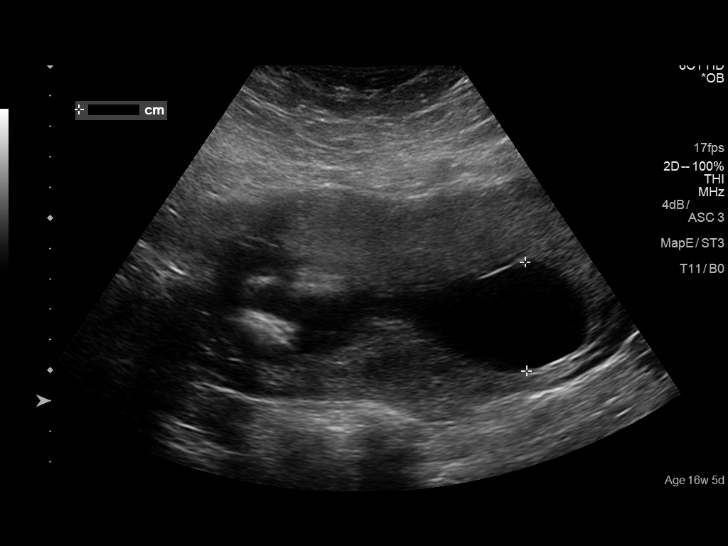
[im 16/34]
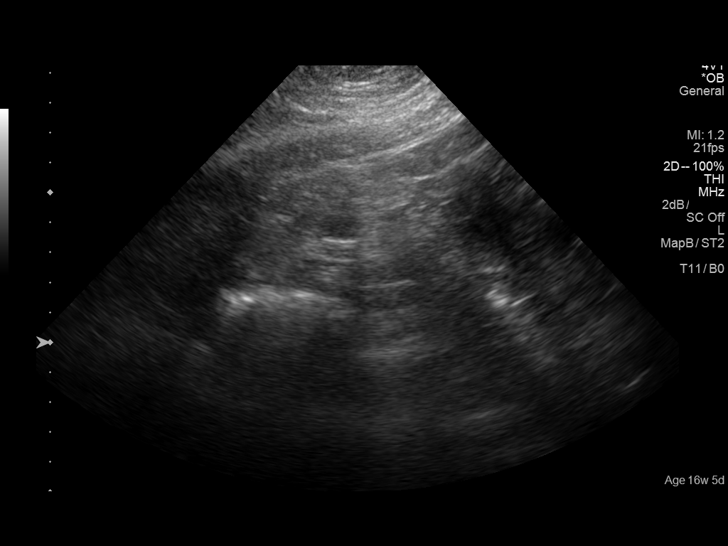
[im 19/34]
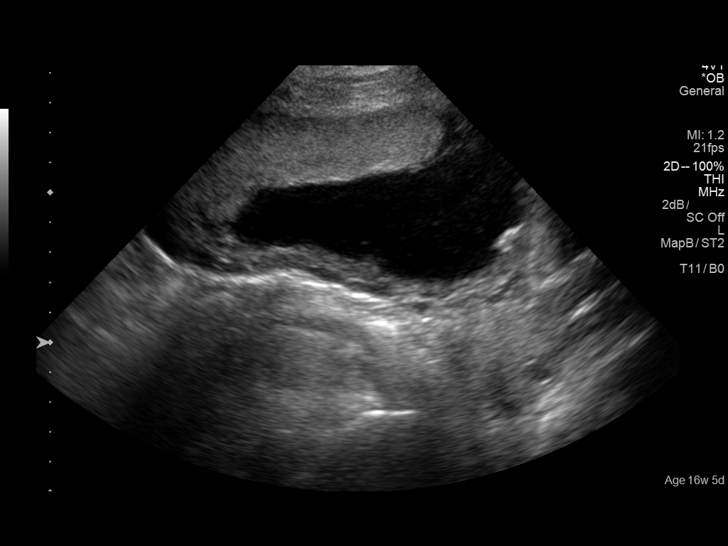
[im 21/34]
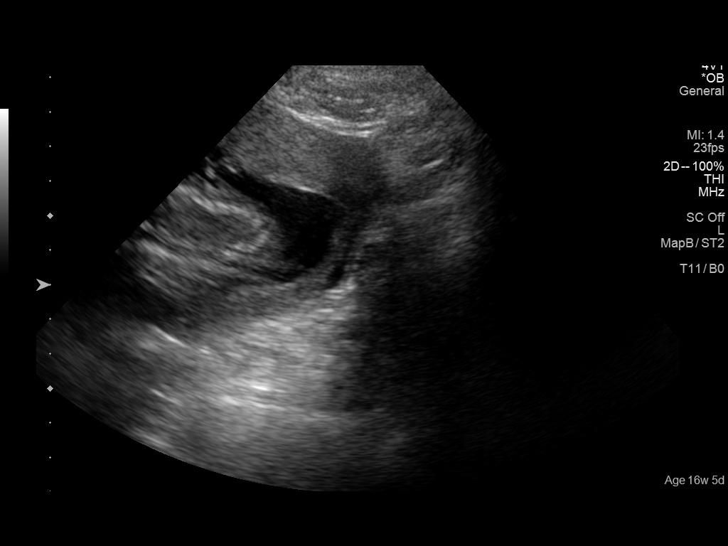
[im 24/34]
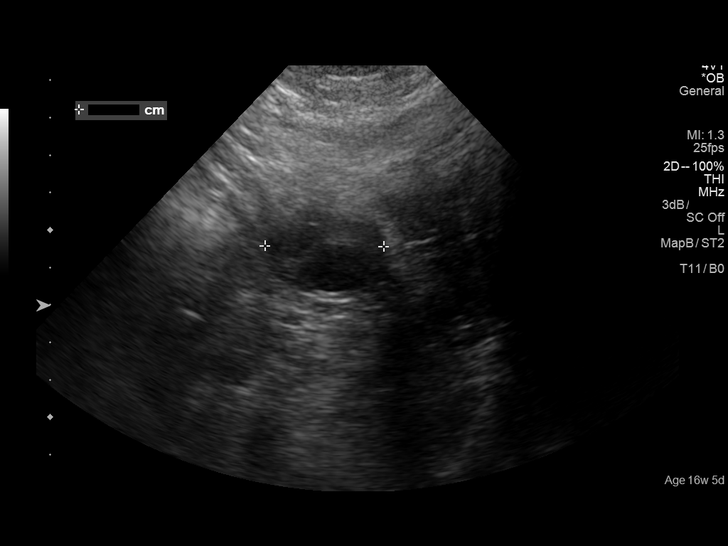
[im 26/34]
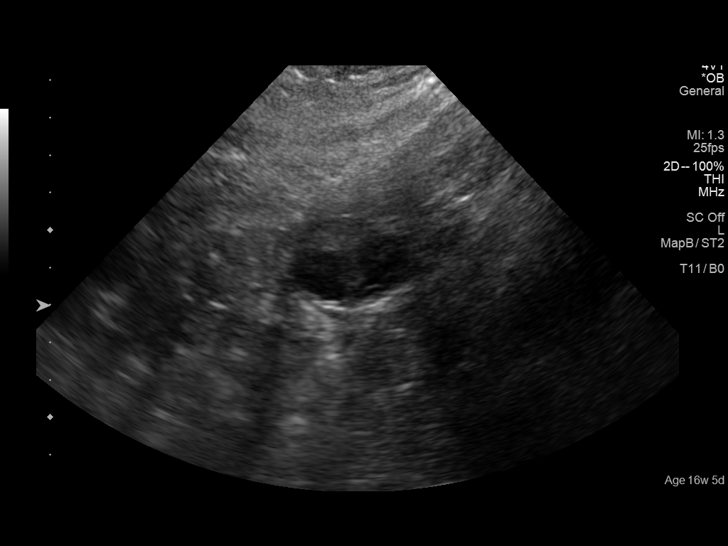
[im 29/34]
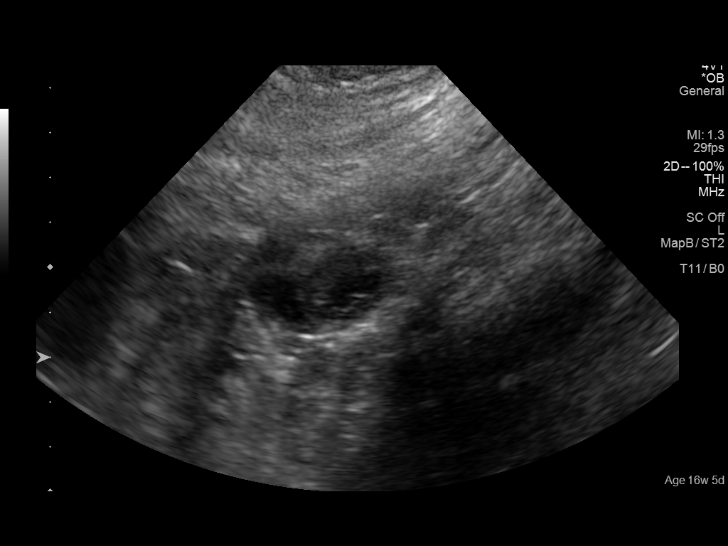
[im 31/34]
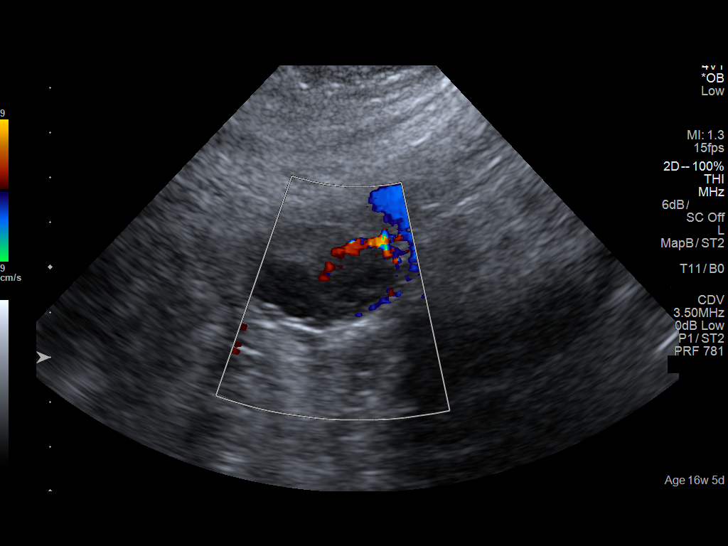
[im 34/34]
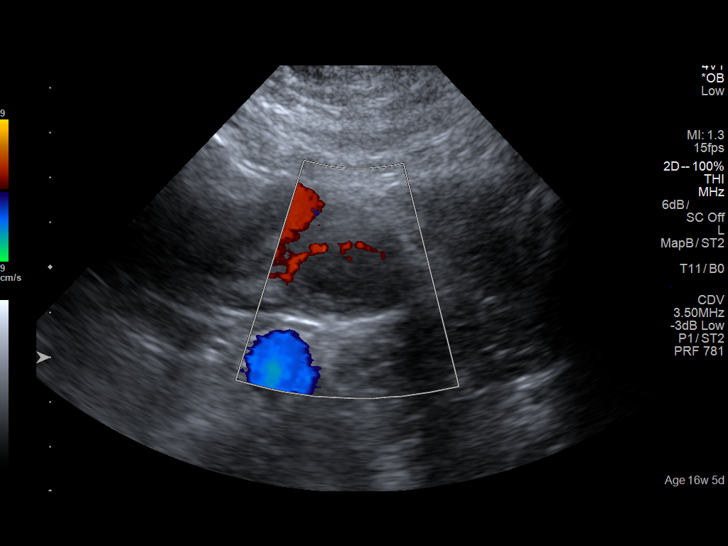

[14 of 28 positions shown; findings below may reference images not displayed]

FINDINGS: Number of Fetuses:  1

Heart Rate:  143 bpm

Presentation: Breech

Placental Location: Anterior. No evidence of subplacental collection

Amniotic Fluid (Subjective):  Within normal limits.

BPD:  3.8cm 17w  5d

MATERNAL FINDINGS:

Cervix:  Appears closed.

Uterus/Adnexae: No abnormality visualized. The right ovary is not
visible. Left ovary is normal including corpus luteum.
IMPRESSION: 1. Single living intrauterine pregnancy measuring 17 weeks 5 days.
No pathologic finding.
2. This exam is performed on an emergent basis and does not
comprehensively evaluate fetal size, dating, or anatomy; follow-up
complete OB US should be considered if further fetal assessment is
warranted.

## 2017-04-03 ENCOUNTER — Inpatient Hospital Stay: Payer: Self-pay

## 2017-04-03 ENCOUNTER — Inpatient Hospital Stay: Payer: Self-pay | Admitting: Oncology

## 2019-05-01 ENCOUNTER — Other Ambulatory Visit: Payer: Self-pay

## 2019-05-01 ENCOUNTER — Emergency Department
Admission: EM | Admit: 2019-05-01 | Discharge: 2019-05-01 | Disposition: A | Discharge status: Home / Self Care | Payer: Self-pay | Attending: Emergency Medicine | Admitting: Emergency Medicine

## 2019-05-01 DIAGNOSIS — Z5321 Procedure and treatment not carried out due to patient leaving prior to being seen by health care provider: Secondary | ICD-10-CM | POA: Insufficient documentation

## 2019-05-01 DIAGNOSIS — J069 Acute upper respiratory infection, unspecified: Secondary | ICD-10-CM | POA: Insufficient documentation

## 2019-05-01 NOTE — ED Triage Notes (Signed)
Pt states sore throat, cough, body aches since 3 days ago. Pt states she has had diarrhea. Pt denies known fever and appears in no acute distress.

## 2019-05-01 NOTE — ED Notes (Signed)
Patient called for room with no answer.  

## 2019-12-18 ENCOUNTER — Ambulatory Visit (INDEPENDENT_AMBULATORY_CARE_PROVIDER_SITE_OTHER): Payer: No Typology Code available for payment source | Admitting: Family Medicine

## 2019-12-18 ENCOUNTER — Encounter: Payer: Self-pay | Admitting: Family Medicine

## 2019-12-18 ENCOUNTER — Other Ambulatory Visit: Payer: Self-pay

## 2019-12-18 VITALS — BP 132/75 | HR 96 | Temp 98.6°F | Resp 16 | Ht 61.5 in | Wt 234.4 lb

## 2019-12-18 DIAGNOSIS — Z79899 Other long term (current) drug therapy: Secondary | ICD-10-CM

## 2019-12-18 DIAGNOSIS — O165 Unspecified maternal hypertension, complicating the puerperium: Secondary | ICD-10-CM | POA: Diagnosis not present

## 2019-12-18 DIAGNOSIS — R400 Somnolence: Secondary | ICD-10-CM | POA: Insufficient documentation

## 2019-12-18 DIAGNOSIS — F419 Anxiety disorder, unspecified: Secondary | ICD-10-CM | POA: Insufficient documentation

## 2019-12-18 DIAGNOSIS — R635 Abnormal weight gain: Secondary | ICD-10-CM | POA: Diagnosis not present

## 2019-12-18 DIAGNOSIS — D649 Anemia, unspecified: Secondary | ICD-10-CM | POA: Diagnosis not present

## 2019-12-18 DIAGNOSIS — R0683 Snoring: Secondary | ICD-10-CM | POA: Insufficient documentation

## 2019-12-18 DIAGNOSIS — Z1159 Encounter for screening for other viral diseases: Secondary | ICD-10-CM

## 2019-12-18 DIAGNOSIS — R030 Elevated blood-pressure reading, without diagnosis of hypertension: Secondary | ICD-10-CM | POA: Insufficient documentation

## 2019-12-18 DIAGNOSIS — R42 Dizziness and giddiness: Secondary | ICD-10-CM | POA: Insufficient documentation

## 2019-12-18 DIAGNOSIS — G4700 Insomnia, unspecified: Secondary | ICD-10-CM | POA: Insufficient documentation

## 2019-12-18 LAB — CBC WITH DIFFERENTIAL/PLATELET
Eosinophils Relative: 1.3 %
Monocytes Relative: 5 %

## 2019-12-18 MED ORDER — HYDROXYZINE HCL 10 MG PO TABS: 10.0000 mg | ORAL_TABLET | Freq: Every evening | ORAL | 0 refills | Status: DC | PRN

## 2019-12-18 MED ORDER — FLUOXETINE HCL 10 MG PO TABS: 10.0000 mg | ORAL_TABLET | Freq: Every day | ORAL | 0 refills | Status: DC

## 2019-12-18 NOTE — Progress Notes (Signed)
Subjective:    Patient ID: Lisa Richard, female    DOB: 1985-12-01, 34 y.o.   MRN: [id number]  Lisa Richard is a 33 y.o. female presenting on 12/18/2019 for Establish Care (pt notice some dizziness with suddden movement x 1-55yrs.Symptoms have worsen over the past week, now when she bend down and look up she feel dizzy and seeing stars.), Anxiety, and Depression   HPI  Previous PCP was > 10 years ago, believes last saw a PCP as her pediatrician.  Records will not be requested.  Past medical, family, and surgical history reviewed w/ pt.  Lisa Richard presents to clinic with concerns of dizziness and visual changes with certain position changes, difficulty sleeping, anxiety and depression.  Reports has a history of anemia and postpartum hypertension that was last evaluated > 3 years ago with her OBGYN.  Denies any recent lab work, evaluation.  Denies any syncope.    Depression screen [facility] 2/9 12/18/2019 11/13/2016  Decreased Interest 3 0  Down, Depressed, Hopeless 2 0  PHQ - 2 Score 5 0  Altered sleeping 3 -  Tired, decreased energy 3 -  Change in appetite 3 -  Feeling bad or failure about yourself  1 -  Trouble concentrating 1 -  Moving slowly or fidgety/restless 0 -  Suicidal thoughts 1 -  PHQ-9 Score 17 -  Difficult doing work/chores Somewhat difficult -    Social History   Tobacco Use  . Smoking status: Current Every Day Smoker    Types: Cigarettes    Last attempt to quit: 06/16/2016    Years since quitting: 3.5  . Smokeless tobacco: Never Used  Vaping Use  . Vaping Use: Never used  Substance Use Topics  . Alcohol use: No  . Drug use: No    Review of Systems  Constitutional: Negative.   HENT: Negative.   Eyes: Negative.        Visual changes with dizziness  Respiratory: Negative.   Cardiovascular: Negative.   Gastrointestinal: Negative.   Endocrine: Negative.   Genitourinary: Negative.   Musculoskeletal: Negative.   Skin: Negative.   Allergic/Immunologic:  Negative.   Neurological: Positive for dizziness. Negative for tremors, seizures, syncope, facial asymmetry, speech difficulty, weakness, light-headedness, numbness and headaches.  Hematological: Negative.   Psychiatric/Behavioral: Positive for dysphoric mood and sleep disturbance. Negative for agitation, behavioral problems, confusion, decreased concentration, hallucinations, self-injury and suicidal ideas. The patient is nervous/anxious. The patient is not hyperactive.    Per HPI unless specifically indicated above     Objective:    BP (!) 132/75 (BP Location: Left Arm, Patient Position: Standing, Cuff Size: Normal)   Pulse 96   Temp 98.6 F (37 C) (Oral)   Resp 16   Ht 5' 1.5" (1.562 m)   Wt (!) 234 lb 6.4 oz (106.3 kg)   LMP 12/02/2019   SpO2 100%   BMI 43.57 kg/m   Wt Readings from Last 3 Encounters:  12/18/19 (!) 234 lb 6.4 oz (106.3 kg)  05/01/19 230 lb (104.3 kg)  02/07/17 201 lb (91.2 kg)    Physical Exam Vitals reviewed.  Constitutional:      General: She is not in acute distress.    Appearance: Normal appearance. She is well-developed and well-groomed. She is morbidly obese. She is not ill-appearing or toxic-appearing.  HENT:     Head: Normocephalic and atraumatic.     Nose:     Comments: Lisa Richard is in place, covering mouth and nose. Eyes:  General: Lids are normal. Vision grossly intact.        Right eye: No discharge.        Left eye: No discharge.     Extraocular Movements: Extraocular movements intact.     Conjunctiva/sclera: Conjunctivae normal.     Pupils: Pupils are equal, round, and reactive to light.  Cardiovascular:     Rate and Rhythm: Normal rate and regular rhythm.     Pulses: Normal pulses.          Dorsalis pedis pulses are 2+ on the right side and 2+ on the left side.     Heart sounds: Normal heart sounds. No murmur heard.  No friction rub. No gallop.   Pulmonary:     Effort: Pulmonary effort is normal. No respiratory distress.      Breath sounds: Normal breath sounds.  Musculoskeletal:     Right lower leg: No edema.     Left lower leg: No edema.  Skin:    General: Skin is warm and dry.     Capillary Refill: Capillary refill takes less than 2 seconds.  Neurological:     General: No focal deficit present.     Mental Status: She is alert and oriented to person, place, and time.     Cranial Nerves: Cranial nerves are intact.     Sensory: Sensation is intact.     Motor: Motor function is intact.     Coordination: Coordination is intact.     Gait: Gait is intact.     Deep Tendon Reflexes: Reflexes are normal and symmetric.  Psychiatric:        Attention and Perception: Attention and perception normal.        Mood and Affect: Mood is anxious and depressed. Affect is flat.        Speech: Speech normal.        Behavior: Behavior normal. Behavior is cooperative.        Thought Content: Thought content normal.        Cognition and Memory: Cognition and memory normal.        Judgment: Judgment normal.    Results for orders placed or performed during the hospital encounter of 01/15/17  Glucose, capillary  Result Value Ref Range   Glucose-Capillary 86 65 - 99 mg/dL  CBC  Result Value Ref Range   WBC 6.5 3.6 - 11.0 K/uL   RBC 4.17 3.80 - 5.20 MIL/uL   Hemoglobin 10.5 (L) 12.0 - 16.0 g/dL   HCT 82.9 (L) 35 - 47 %   MCV 75.6 (L) 80.0 - 100.0 fL   MCH 25.2 (L) 26.0 - 34.0 pg   MCHC 33.3 32.0 - 36.0 g/dL   RDW 56.2 (H) 13.0 - 86.5 %   Platelets 171 150 - 440 K/uL  Comprehensive metabolic panel  Result Value Ref Range   Sodium 133 (L) 135 - 145 mmol/L   Potassium 4.0 3.5 - 5.1 mmol/L   Chloride 106 101 - 111 mmol/L   CO2 21 (L) 22 - 32 mmol/L   Glucose, Bld 84 65 - 99 mg/dL   BUN 6 6 - 20 mg/dL   Creatinine, Ser 7.84 0.44 - 1.00 mg/dL   Calcium 8.6 (L) 8.9 - 10.3 mg/dL   Total Protein 6.7 6.5 - 8.1 g/dL   Albumin 2.9 (L) 3.5 - 5.0 g/dL   AST 27 15 - 41 U/L   ALT 10 (L) 14 - 54 U/L   Alkaline Phosphatase 82 38  -  126 U/L   Total Bilirubin 0.9 0.3 - 1.2 mg/dL   GFR calc non Af Amer >60 >60 mL/min   GFR calc Af Amer >60 >60 mL/min   Anion gap 6 5 - 15  Protein / creatinine ratio, urine  Result Value Ref Range   Creatinine, Urine 62 mg/dL   Total Protein, Urine 6 mg/dL   Protein Creatinine Ratio 0.10 0.00 - 0.15 mg/mg[Cre]  CBC with Differential/Platelet  Result Value Ref Range   WBC 13.9 (H) 3.6 - 11.0 K/uL   RBC 3.06 (L) 3.80 - 5.20 MIL/uL   Hemoglobin 7.5 (L) 12.0 - 16.0 g/dL   HCT 26.9 (L) 35 - 47 %   MCV 78.1 (L) 80.0 - 100.0 fL   MCH 24.5 (L) 26.0 - 34.0 pg   MCHC 31.4 (L) 32.0 - 36.0 g/dL   RDW 48.5 (H) 46.2 - 70.3 %   Platelets 198 150 - 440 K/uL   Neutrophils Relative % 83 %   Neutro Abs 11.5 (H) 1.4 - 6.5 K/uL   Lymphocytes Relative 13 %   Lymphs Abs 1.8 1.0 - 3.6 K/uL   Monocytes Relative 4 %   Monocytes Absolute 0.6 0 - 0 K/uL   Eosinophils Relative 0 %   Eosinophils Absolute 0.0 0 - 0 K/uL   Basophils Relative 0 %   Basophils Absolute 0.0 0 - 0 K/uL  CBC with Differential/Platelet  Result Value Ref Range   WBC 14.9 (H) 3.6 - 11.0 K/uL   RBC 3.37 (L) 3.80 - 5.20 MIL/uL   Hemoglobin 8.5 (L) 12.0 - 16.0 g/dL   HCT 50.0 (L) 35 - 47 %   MCV 78.0 (L) 80.0 - 100.0 fL   MCH 25.3 (L) 26.0 - 34.0 pg   MCHC 32.4 32.0 - 36.0 g/dL   RDW 93.8 (H) 18.2 - 99.3 %   Platelets 167 150 - 440 K/uL   Neutrophils Relative % 86 %   Neutro Abs 13.0 (H) 1.4 - 6.5 K/uL   Lymphocytes Relative 8 %   Lymphs Abs 1.2 1.0 - 3.6 K/uL   Monocytes Relative 5 %   Monocytes Absolute 0.7 0 - 0 K/uL   Eosinophils Relative 0 %   Eosinophils Absolute 0.0 0 - 0 K/uL   Basophils Relative 1 %   Basophils Absolute 0.1 0 - 0 K/uL  CBC  Result Value Ref Range   WBC 9.2 3.6 - 11.0 K/uL   RBC 2.81 (L) 3.80 - 5.20 MIL/uL   Hemoglobin 7.3 (L) 12.0 - 16.0 g/dL   HCT 71.6 (L) 35 - 47 %   MCV 77.4 (L) 80.0 - 100.0 fL   MCH 26.0 26.0 - 34.0 pg   MCHC 33.6 32.0 - 36.0 g/dL   RDW 96.7 (H) 89.3 - 81.0 %    Platelets 137 (L) 150 - 440 K/uL  CBC  Result Value Ref Range   WBC 8.1 3.6 - 11.0 K/uL   RBC 2.64 (L) 3.80 - 5.20 MIL/uL   Hemoglobin 6.9 (L) 12.0 - 16.0 g/dL   HCT 17.5 (L) 35 - 47 %   MCV 78.1 (L) 80.0 - 100.0 fL   MCH 26.1 26.0 - 34.0 pg   MCHC 33.5 32.0 - 36.0 g/dL   RDW 10.2 (H) 58.5 - 27.7 %   Platelets 148 (L) 150 - 440 K/uL  Comprehensive metabolic panel  Result Value Ref Range   Sodium 136 135 - 145 mmol/L   Potassium 3.7 3.5 - 5.1 mmol/L  Chloride 105 101 - 111 mmol/L   CO2 26 22 - 32 mmol/L   Glucose, Bld 94 65 - 99 mg/dL   BUN 10 6 - 20 mg/dL   Creatinine, Ser 7.93 0.44 - 1.00 mg/dL   Calcium 8.6 (L) 8.9 - 10.3 mg/dL   Total Protein 5.6 (L) 6.5 - 8.1 g/dL   Albumin 2.5 (L) 3.5 - 5.0 g/dL   AST 19 15 - 41 U/L   ALT 9 (L) 14 - 54 U/L   Alkaline Phosphatase 56 38 - 126 U/L   Total Bilirubin 0.5 0.3 - 1.2 mg/dL   GFR calc non Af Amer >60 >60 mL/min   GFR calc Af Amer >60 >60 mL/min   Anion gap 5 5 - 15  Glucose, capillary  Result Value Ref Range   Glucose-Capillary 89 65 - 99 mg/dL  Hemoglobin and hematocrit, blood  Result Value Ref Range   Hemoglobin 8.0 (L) 12.0 - 16.0 g/dL   HCT 90.3 (L) 35 - 47 %  Glucose, capillary  Result Value Ref Range   Glucose-Capillary 113 (H) 65 - 99 mg/dL  Prepare RBC  Result Value Ref Range   Order Confirmation ORDER PROCESSED BY BLOOD BANK   Prepare RBC  Result Value Ref Range   Order Confirmation ORDER PROCESSED BY BLOOD BANK       Assessment & Plan:   Problem List Items Addressed This Visit      Cardiovascular and Mediastinum   Postpartum hypertension - Primary   Relevant Orders   CBC with Differential   COMPLETE METABOLIC PANEL WITH GFR   Thyroid Panel With TSH   Lipid Profile     Other   Anemia    Postpartum anemia reported.  Reports no labs after delivery of child > 3 years ago.  Unknown status.  Will have labs ordered and f/u in 2 weeks.  Plan: 1. Labs to be completed today 2. RTC in 2 weeks       Relevant Orders   CBC with Differential   Weight gain   Relevant Orders   Thyroid Panel With TSH   Lipid Profile   Elevated blood pressure reading    Elevated blood pressure in clinic with history of postpartum hypertension and short course of labetalol.  Reports has not had follow up after delivery of child > 3 years ago.  Has not had headaches but has had some dizziness with positional changes and visual changes associated with that dizziness.  Has not had syncope.  POCT EKG in office ordered and NSR at 87 bpm.  Orthostatic vitals taken with slight elevation in heart rate.  Likely due to volume depletion.  Encouraged increased water intake and decreased caffeine intake.  Will re-evaluate in 2 weeks.  Plan: 1. Increase water intake and decreased caffeine intake 2. RTC in 2 weeks 3. Labs ordered and to be drawn today      Relevant Orders   CBC with Differential   COMPLETE METABOLIC PANEL WITH GFR   Thyroid Panel With TSH   Lipid Profile   Dizziness    See elevated BP reading A/P      Relevant Orders   EKG 12-Lead   Anxiety    PHQ9-17/GAD7-16.  Reports mixed anxiety and depression.  Having difficulties with sleep and some snoring, waking up feeling fatigued in the morning.  Has not been diagnosed with anxiety or depression in the morning.  Interested in beginning treatment for both.  Discussed options such as sertraline vs  fluoxetine, PRN medications such as buspar and hydroxyzine.  Patient interested in starting low dose fluoxetine and hydroxyzine.  Mood handout reviewed and provided.  Plan: 1. Begin fluoxetine 10mg  daily, can take at nighttime if makes her sleepy 2. Begin hydroxyzine 10mg  1 hour before bedtime for anxiety induced insomnia 3. Review mood handout 4. RTC in 2 weeks for re-evaluation      Relevant Medications   FLUoxetine (PROZAC) 10 MG tablet   hydrOXYzine (ATARAX/VISTARIL) 10 MG tablet   Insomnia    Reports difficulty sleeping with anxiety.  See anxiety a/p        Relevant Medications   hydrOXYzine (ATARAX/VISTARIL) 10 MG tablet   Snoring    Snoring with daytime fatigue/sleepiness.  No witnessed apneas.  Discussed importance of labs and sleep study.  Plan: 1. Labs ordered to be drawn today 2. Sleep study ordered 3. RTC in 2 weeks      Relevant Orders   Nocturnal polysomnography   Daytime sleepiness   Relevant Orders   Nocturnal polysomnography    Other Visit Diagnoses    Long-term use of high-risk medication       Relevant Orders   Thyroid Panel With TSH   Lipid Profile   Encounter for hepatitis C screening test for low risk patient       Relevant Orders   Hepatitis C Antibody      Meds ordered this encounter  Medications  . FLUoxetine (PROZAC) 10 MG tablet    Sig: Take 1 tablet (10 mg total) by mouth daily.    Dispense:  30 tablet    Refill:  0  . hydrOXYzine (ATARAX/VISTARIL) 10 MG tablet    Sig: Take 1 tablet (10 mg total) by mouth at bedtime as needed.    Dispense:  30 tablet    Refill:  0      Follow up plan: Return in about 2 weeks (around 01/01/2020) for CPE & PAP testing.   Charlaine DaltonNicole Marie Haneen Bernales, FNP Family Nurse Practitioner [facility] 12/18/2019, 4:22 PM

## 2019-12-18 NOTE — Assessment & Plan Note (Addendum)
PHQ9-17/GAD7-16.  Reports mixed anxiety and depression.  Having difficulties with sleep and some snoring, waking up feeling fatigued in the morning.  Has not been diagnosed with anxiety or depression in the morning.  Interested in beginning treatment for both.  Discussed options such as sertraline vs fluoxetine, PRN medications such as buspar and hydroxyzine.  Patient interested in starting low dose fluoxetine and hydroxyzine.  Mood handout reviewed and provided.  Plan: 1. Begin fluoxetine 10mg  daily, can take at nighttime if makes her sleepy 2. Begin hydroxyzine 10mg  1 hour before bedtime for anxiety induced insomnia 3. Review mood handout 4. RTC in 2 weeks for re-evaluation

## 2019-12-18 NOTE — Patient Instructions (Addendum)
Have your labs drawn today and we will contact you with the results.  Increase you water to 100 ounces per day, including a supplement to replenish your electrolytes (such as gatorade, powerade, etc).  Decrease your caffeine intake to 1 cup per day.  I have sent in a prescription for fluoxetine 21m to take 1 tablet daily for your anxiety/depression.  If you find that this makes you tired, can take before bed.  I have sent in a prescription for hydroxyzine 129mto take 1 tablet 1 hour before bed to help with insomnia.  A referral for a sleep study has been placed today.  If you have not heard from the specialty office or our referral coordinator within 1 week, please let usKoreanow and we will follow up with the referral coordinator for an update.  The following recommendations are helpful adjuncts for helping rebalance your mood.  Eat a nourishing diet. Ensure adequate intake of calories, protein, carbs, fat, vitamins, and minerals. Prioritize whole foods at each meal, including meats, vegetables, fruits, nuts and seeds, etc.   Avoid inflammatory and/or "junk" foods, such as sugar, omega-6 fats, refined grains, chemicals, and preservatives are common in packaged and prepared foods. Minimize or completely avoid these ingredients and stick to whole foods with little to no additives. Cook from scratch as much as possible for more control over what you eat  Get enough sleep. Poor sleep is significantly associated with depression and anxiety. Make 7-9 hours of sleep nightly a top priority  Exercise appropriately. Exercise is known to improve brain functioning and boost mood. Aim for 30 minutes of daily physical activity. Avoid "overtraining," which can cause mental disturbances  Assess your light exposure. Not enough natural light during the day and too much artificial light can have a major impact on your mood. Get outside as often as possible during daylight hours. Minimize light exposure after dark  and avoid the use of electronics that give off blue light before bed  Manage your stress.  Use daily stress management techniques such as meditation, yoga, or mindfulness to retrain your brain to respond differently to stress. Try deep breathing to deactivate your "fight or flight" response.  There are many of sources with apps like Headspace, Calm or a variety of YouTube videos (videos from JaGwynne Edingerave guided meditation)  Prioritize your social life. Work on building social support with new friends or improve current relationships. Consider getting a pet that allows for companionship, social interaction, and physical touch. Try volunteering or joining a faith-based community to increase your sense of purpose  4-7-8 breathing technique at bedtime: breathe in to count of 4, hold breath for count of 7, exhale for count of 8; do 3-5 times for letting go of overactive thoughts  Take time to play Unstructured "play" time can help reduce anxiety and depression Options for play include music, games, sports, dance, art, etc.  Try to add daily omega 3 fatty acids, magnesium, B complex, and balanced amino acid supplements to help improve mood and anxiety.  Sleep hygiene is the single most effective treatment for sleep issues, but it is hard work.  Tips for a good night's sleep:  -Keep sleep environment comfortable and conducive to sleep -Keep regular sleep schedule 7 nights a week -Avoiding naps during the day -Avoiding going to bed until drowsy and ready to sleep, not trying to sleep, and not watching the clock -Get out of bed if not asleep within 15-20 minutes and returning only when  drowsy -Avoiding caffeine, nicotine, alcohol, and other substances that interfere with sleep before bedtime -Take an hour before your set bedtime and start to wind down: bath/shower, no more TV or phone (the blue light can interfere with sleeping), listen to soothing music, or meditation -No TV in your  bedroom -Exercising regularly, at least 6 hours before sleep. Yoga and Tai Chi can improve sleep quality  There are a lot of books and apps that may help guide you with any of the following:   -Progressive muscle relaxation (involves methodical tension and relaxation of different Muscle groups throughout body)  Guided imagery  -YouTube - Gwynne Edinger has free videos on YouTube that can help with meditation and some   Abdominal breathing   Over the counter sleep aid one hour before bed- and gradually wean your use over 2-4 weeks  Some examples are : *Melatonin 5-10 mg *Sleepology (Can find on [employer]) taken according to packaging directions  There are a few online evidence based online programs, unfortunately they are not free.   Developed by a sleep expert who created a drug-free program for insomnia proven more effective than sleeping pills.  www.cbtforinsomnia.com Sleepio is an evidence-based digital sleep improvement program   www.sleepio.com SHUTi is designed to actively help retrain your body and mind for great sleep through six engaging Cognitive Behavioral Therapy for Insomnia strategy and learning sessions  [email or web address]  Your blood pressure was elevated today at your exam, we will plan to recheck these at your next visit in 2 weeks.  Try to get exercise a minimum of 30 minutes per day at least 5 days per week as well as  adequate water intake all while measuring blood pressure a few times per week.  Keep a blood pressure log and bring back to clinic at your next visit.  If your readings are consistently over 130/80 to contact our office/send me a MyChart message and we will see you sooner.  We will plan to see you back in 2 weeks for your physical  You will receive a survey after today's visit either digitally by e-mail or paper by [place] mail. Your experiences and feedback matter to [place].  Please respond so we know how we are doing as we provide care for you.   Call us with any questions/concerns/needs.  It is my goal to be available to you for your health concerns.  Thanks for choosing me to be a partner in your healthcare needs!  Harlin Rain, FNP-C Family Nurse Practitioner [facility] Phone: [phone]

## 2019-12-18 NOTE — Assessment & Plan Note (Signed)
Reports difficulty sleeping with anxiety.  See anxiety a/p

## 2019-12-18 NOTE — Assessment & Plan Note (Signed)
Postpartum anemia reported.  Reports no labs after delivery of child > 3 years ago.  Unknown status.  Will have labs ordered and f/u in 2 weeks.  Plan: 1. Labs to be completed today 2. RTC in 2 weeks

## 2019-12-18 NOTE — Assessment & Plan Note (Signed)
Snoring with daytime fatigue/sleepiness.  No witnessed apneas.  Discussed importance of labs and sleep study.  Plan: 1. Labs ordered to be drawn today 2. Sleep study ordered 3. RTC in 2 weeks

## 2019-12-18 NOTE — Assessment & Plan Note (Signed)
See elevated BP reading A/P

## 2019-12-18 NOTE — Assessment & Plan Note (Signed)
Elevated blood pressure in clinic with history of postpartum hypertension and short course of labetalol.  Reports has not had follow up after delivery of child > 3 years ago.  Has not had headaches but has had some dizziness with positional changes and visual changes associated with that dizziness.  Has not had syncope.  POCT EKG in office ordered and NSR at 87 bpm.  Orthostatic vitals taken with slight elevation in heart rate.  Likely due to volume depletion.  Encouraged increased water intake and decreased caffeine intake.  Will re-evaluate in 2 weeks.  Plan: 1. Increase water intake and decreased caffeine intake 2. RTC in 2 weeks 3. Labs ordered and to be drawn today

## 2019-12-19 LAB — CBC WITH DIFFERENTIAL/PLATELET
Eosinophils Absolute: 99 cells/uL (ref 15–500)
MCV: 72.4 fL — ABNORMAL LOW (ref 80.0–100.0)
Total Lymphocyte: 26.8 %

## 2019-12-19 LAB — LIPID PANEL
LDL Cholesterol (Calc): 101 mg/dL (calc) — ABNORMAL HIGH
Non-HDL Cholesterol (Calc): 129 mg/dL (calc) (ref <130)

## 2019-12-19 LAB — COMPLETE METABOLIC PANEL WITH GFR
ALT: 26 U/L (ref 6–29)
Alkaline phosphatase (APISO): 75 U/L (ref 31–125)
BUN: 9 mg/dL (ref 7–25)

## 2019-12-19 LAB — THYROID PANEL WITH TSH: T4, Total: 9.3 ug/dL (ref 5.1–11.9)

## 2019-12-22 ENCOUNTER — Telehealth: Payer: Self-pay | Admitting: Family Medicine

## 2019-12-22 NOTE — Telephone Encounter (Signed)
Patient called to check on a referral request that the doctor said she would send in for a chiropractor for the patient.  Please advise and call patient to give her an update at [phone]

## 2019-12-23 ENCOUNTER — Encounter: Payer: Self-pay | Admitting: Family Medicine

## 2019-12-23 ENCOUNTER — Other Ambulatory Visit: Payer: Self-pay | Admitting: Family Medicine

## 2019-12-23 DIAGNOSIS — M545 Low back pain, unspecified: Secondary | ICD-10-CM

## 2019-12-23 DIAGNOSIS — G8929 Other chronic pain: Secondary | ICD-10-CM

## 2019-12-23 NOTE — Progress Notes (Signed)
Pt requested referral to chiropractor

## 2019-12-24 LAB — CBC WITH DIFFERENTIAL/PLATELET
Absolute Monocytes: 380 cells/uL (ref 200–950)
Basophils Absolute: 68 cells/uL (ref 0–200)
Basophils Relative: 0.9 %
HCT: 33.9 % — ABNORMAL LOW (ref 35.0–45.0)
Hemoglobin: 9.7 g/dL — ABNORMAL LOW (ref 11.7–15.5)
Lymphs Abs: 2037 cells/uL (ref 850–3900)
MCH: 20.7 pg — ABNORMAL LOW (ref 27.0–33.0)
MCHC: 28.6 g/dL — ABNORMAL LOW (ref 32.0–36.0)
MPV: 11.6 fL (ref 7.5–12.5)
Neutro Abs: 5016 cells/uL (ref 1500–7800)
Neutrophils Relative %: 66 %
Platelets: 382 10*3/uL (ref 140–400)
RBC: 4.68 10*6/uL (ref 3.80–5.10)
RDW: 15 % (ref 11.0–15.0)
WBC: 7.6 10*3/uL (ref 3.8–10.8)

## 2019-12-24 LAB — COMPLETE METABOLIC PANEL WITH GFR
AG Ratio: 1.1 (calc) (ref 1.0–2.5)
AST: 38 U/L — ABNORMAL HIGH (ref 10–30)
Albumin: 4.3 g/dL (ref 3.6–5.1)
CO2: 26 mmol/L (ref 20–32)
Calcium: 9.9 mg/dL (ref 8.6–10.2)
Chloride: 99 mmol/L (ref 98–110)
Creat: 0.86 mg/dL (ref 0.50–1.10)
GFR, Est African American: 104 mL/min/{1.73_m2} (ref >=60)
GFR, Est Non African American: 90 mL/min/{1.73_m2} (ref >=60)
Globulin: 3.8 g/dL (calc) — ABNORMAL HIGH (ref 1.9–3.7)
Glucose, Bld: 237 mg/dL — ABNORMAL HIGH (ref 65–99)
Potassium: 4.4 mmol/L (ref 3.5–5.3)
Sodium: 136 mmol/L (ref 135–146)
Total Bilirubin: 0.7 mg/dL (ref 0.2–1.2)
Total Protein: 8.1 g/dL (ref 6.1–8.1)

## 2019-12-24 LAB — THYROID PANEL WITH TSH
Free Thyroxine Index: 2.8 (ref 1.4–3.8)
T3 Uptake: 30 % (ref 22–35)
TSH: 1.36 mIU/L

## 2019-12-24 LAB — LIPID PANEL
Cholesterol: 162 mg/dL (ref <200)
HDL: 33 mg/dL — ABNORMAL LOW (ref >=50)
Total CHOL/HDL Ratio: 4.9 (calc) (ref <5.0)
Triglycerides: 187 mg/dL — ABNORMAL HIGH (ref <150)

## 2019-12-24 LAB — HEPATITIS C ANTIBODY
Hepatitis C Ab: NONREACTIVE
SIGNAL TO CUT-OFF: 0.02 (ref <1.00)

## 2019-12-24 LAB — HEMOGLOBIN A1C W/OUT EAG: Hgb A1c MFr Bld: 9.4 % of total Hgb — ABNORMAL HIGH (ref <5.7)

## 2019-12-24 NOTE — Telephone Encounter (Signed)
Copied from CRM [id number]. Topic: General - Other >> Dec 24, 2019 12:42 PM Marylen Ponto wrote: Reason for CRM: Pt returned call to the office and scheduled appt for tomorrow but still requested that Tedd Sias return her call.   I attempted to reach the patient again by phone. No answer.

## 2019-12-25 ENCOUNTER — Encounter: Payer: Self-pay | Admitting: Family Medicine

## 2019-12-25 ENCOUNTER — Other Ambulatory Visit: Payer: Self-pay

## 2019-12-25 ENCOUNTER — Ambulatory Visit (INDEPENDENT_AMBULATORY_CARE_PROVIDER_SITE_OTHER): Payer: No Typology Code available for payment source | Admitting: Family Medicine

## 2019-12-25 VITALS — BP 128/63 | HR 97 | Temp 98.2°F | Ht 61.5 in | Wt 233.6 lb

## 2019-12-25 DIAGNOSIS — E1165 Type 2 diabetes mellitus with hyperglycemia: Secondary | ICD-10-CM

## 2019-12-25 MED ORDER — ATORVASTATIN CALCIUM 10 MG PO TABS: 10.0000 mg | ORAL_TABLET | Freq: Every day | ORAL | 3 refills | Status: DC

## 2019-12-25 MED ORDER — LISINOPRIL 5 MG PO TABS: 5.0000 mg | ORAL_TABLET | Freq: Every day | ORAL | 3 refills | Status: DC

## 2019-12-25 MED ORDER — METFORMIN HCL 500 MG PO TABS: ORAL_TABLET | ORAL | 0 refills | Status: DC

## 2019-12-25 MED ORDER — BLOOD GLUCOSE METER KIT: PACK | 0 refills | Status: DC

## 2019-12-25 NOTE — Assessment & Plan Note (Signed)
New onset diagnosis of diabetes.  A1C on 12/18/2019 at 9.4%.  Family history of diabetes and personal history of gestational diabetes.  Time spent discussing diabetes education, food choices, checking CBG and logging these, as well as how to take metformin.  Discussed reducing ASCVD risk with low dose atorvastatin and working towards kidney protection with low dose ACEi.  Patient in agreement with treatment plan.  Plan: 1. Begin metformin 500mg  with breakfast x 7 days then increase to twice daily with meals 2. Begin atorvastatin 10mg  daily at dinnertime for ASCVD reduction 3. Begin lisinopril 5mg  daily to help with kidney protection 4. Check AM CBG and log these.  Bring log back to next appointment in 3 months 5. Encouraged to schedule DM eye exam, local list of providers given 6. RTC in 3 months

## 2019-12-25 NOTE — Patient Instructions (Addendum)
Your provider would like to you have your annual eye exam. Please contact your current eye doctor or here are some good options for you to contact.   [facility]   Address: [address] [place], [place] [address] Phone: [phone]  Website: [email or web address]   [facility] [address] [place], [place] [address] Phone: [phone] [email or web address]  [facility]  Address: [address] [place], [place], [place] [address] Phone: [phone]   [facility] [address] [place], [place] [address] Phone: [phone]  [facility] Address: [address] [place], [place], [place] [address]  Phone: [phone]  I have sent in a prescription for Metformin to take 1 tablet in the morning with breakfast for 7 days then increase to 1 tablet twice a day with meals.  Take your blood glucose first thing in the morning and log on the sheet I gave you today.  I have sent in a prescription for Atorvastatin 10mg , to help reduce ASCVD risk, with your new diagnosis of diabetes.  This is a tablet to take once per day with dinner.  Start lisinopril 5mg  mg once daily.    Some of the possible side effects are:  - angioedema: swelling of lips, mouth, and tongue.  If this rare side effect occurs, please go to ED. - cough: you could develop a dry, hacking cough caused by this medicine.  If it occurs, it will go away after stopping this medicine.  Call the clinic before stopping the medication. - kidney damage: we will monitor your labs when we start this medicine and at least one time per year.  If you do not have an change in kidney function when starting this medicine, it will provide kidney protection over time.  Advice on Protecting Your Feet   1. Daily foot inspections - Look for any breaks in the skin or areas of irritation such as blisters or red areas. Report to primary doctor or foot nurse immediately if any problems occur.  - If you have vision problems and  cannot see your feet well or it is hard for you to reach your feet, ask a family member to inspect your feet daily.   2. Daily foot hygiene  - Wash feet daily, but do not soak your feet in hot water. If you do have callus, you may use warm water epsom salt foot soak and use moisturizer after. - Dry well especially between toes; Pat dry do not rub.  - If your skin is dry use a lotion to moisturize but never between the toes.  - If your skin is wet from perspiration, use an antifungal foot powder daily.   3. Shoes and Socks  - Wear a clean pair of socks daily.  - Make sure your shoes fit well and that you are measured and properly fit each time you purchase shoes. Your shoe size may change.  - Powder your shoes with a small amount of an antifungal foot powder daily, as the shoe is the only article of clothing that is not laundered.  - Wear appropriate shoes and socks for the weather. It is especially important to protect your feet from the cold; however, don't forget about sunscreen to the tops of your feet in the hot sun.  - Wear well fitting shoes rather than slippers or flip-flops when walking or standing for long period of time.  - When at home make sure you always have protective foot wear  on your feet.   Eat at least 3 meals and 1-2 snacks per day (don't skip breakfast).  Aim for no more than 5 hours between eating. - Tip: If you go >5 hours without eating and become very hungry, your body will supply it's own resources temporarily and you can gain extra weight when you eat.  The 5 Minute Rule of Exercise - Promise yourself to at least do 5 minutes of exercise (make sure you time it), and if at the end of 5 minutes (this is the hardest part of the work-out), if you still feel like you want stop (or not motivated to continue) then allow yourself to stop. Otherwise, more often than not you will feel encouraged that you can continue for a little while longer or even more!  My 5 to  Fitness!  5: fruits and vegetables per day (work on 9 per day if you are at 5) 4: exercise 4-5 times per week for at least 30 minutes (walking counts!) 3: meals per day (don't skip breakfast!), no more than 5 hours between meals 2: habits to quit -smoking -excess alcohol use (men >2 beer/day; women >1beer/day) 1: sweet per day (2 cookies, 1 small cup of ice cream, 12 oz soda)  These are general tips for healthy living. Try to start with 1 or 2 habit TODAY and make it a part of your life for several months. You set a goal today to work on:  Once you have 1 or 2 habits down for several months, try to begin working on your next healthy habit. With every single step you take, you will be leading a healthier lifestyle!   Diet Recommendations for Diabetes   Starchy (carb) foods include: Bread, rice, pasta, potatoes, corn, crackers, bagels, muffins, all baked goods.   Protein foods include: Meat, fish, poultry, eggs, dairy foods, and beans such as pinto and kidney beans (beans also provide carbohydrate).   1. Eat at least 3 meals and 1-2 snacks per day. Never go more than 4-5 hours while awake without eating.   2. Limit starchy foods to TWO per meal and ONE per snack. ONE portion of a starchy  food is equal to the following:   - ONE slice of bread (or its equivalent, such as half of a hamburger bun).   - 1/2 cup of a "scoopable" starchy food such as potatoes or rice.   - 1 OUNCE (28 grams) of starchy snacks (crackers or pretzels, look on label).   - 15 grams of carbohydrate as shown on food label.   3. Both lunch and dinner should include a protein food, a carb food, and vegetables.   - Obtain twice as many veg's as protein or carbohydrate foods for both lunch and dinner.   - Try to keep frozen veg's on hand for a quick vegetable serving.     - Fresh or frozen veg's are best.   4. Breakfast should always include protein.    You can learn more information online about your diabetes at  American Diabetes Association: http://www.diabetes.org/ - General self-care (diet, medications, blood sugar checks). - Diet recommendations - There are even recipes available for you to look at and try.  We will plan to see you back in 3 months for diabetes follow up visit and A1C  You will receive a survey after today's visit either digitally by e-mail or paper by [employer]. Your experiences and feedback matter to [place].  Please respond so we know how  we are doing as we provide care for you.  Call us with any questions/concerns/needs.  It is my goal to be available to you for your health concerns.  Thanks for choosing me to be a partner in your healthcare needs!  Charlaine Dalton, FNP-C Family Nurse Practitioner [facility] Phone: [phone]

## 2019-12-25 NOTE — Progress Notes (Signed)
Subjective:    Patient ID: Lisa Richard, female    DOB: [birth year], 34 y.o.   MRN: [id number]  Lisa Richard is a 34 y.o. female presenting on 12/25/2019 for Diabetes   HPI  Lisa Richard presents to clinic for follow up on her 12/18/2019 labs with new diagnosis of diabetes mellitus.  Was previously diagnosed with gestational diabetes and has maternal family history of diabetes.  Denies polyuria, polyphagia, polydipsia, visual changes, numbness, tingling, weakness, non-healing wounds.    Depression screen [facility] 2/9 12/18/2019 11/13/2016  Decreased Interest 3 0  Down, Depressed, Hopeless 2 0  PHQ - 2 Score 5 0  Altered sleeping 3 -  Tired, decreased energy 3 -  Change in appetite 3 -  Feeling bad or failure about yourself  1 -  Trouble concentrating 1 -  Moving slowly or fidgety/restless 0 -  Suicidal thoughts 1 -  PHQ-9 Score 17 -  Difficult doing work/chores Somewhat difficult -    Social History   Tobacco Use  . Smoking status: Current Every Day Smoker    Types: Cigarettes    Last attempt to quit: 06/16/2016    Years since quitting: 3.5  . Smokeless tobacco: Never Used  Vaping Use  . Vaping Use: Never used  Substance Use Topics  . Alcohol use: No  . Drug use: No    Review of Systems  Constitutional: Negative.   HENT: Negative.   Eyes: Negative.   Respiratory: Negative.   Cardiovascular: Negative.   Gastrointestinal: Negative.   Endocrine: Negative.   Genitourinary: Negative.   Musculoskeletal: Negative.   Skin: Negative.   Allergic/Immunologic: Negative.   Neurological: Negative.   Hematological: Negative.   Psychiatric/Behavioral: Negative.    Per HPI unless specifically indicated above     Objective:    BP (!) 128/63 (BP Location: Left Arm, Patient Position: Sitting, Cuff Size: Large)   Pulse 97   Temp 98.2 F (36.8 C) (Oral)   Ht 5' 1.5" (1.562 m)   Wt (!) 233 lb 10.2 oz (106 kg)   LMP 12/02/2019   BMI 43.43 kg/m   Wt Readings from Last 3  Encounters:  12/25/19 (!) 233 lb 10.2 oz (106 kg)  12/18/19 (!) 234 lb 6.4 oz (106.3 kg)  05/01/19 230 lb (104.3 kg)    Physical Exam Vitals reviewed.  Constitutional:      General: She is not in acute distress.    Appearance: Normal appearance. She is well-developed and well-groomed. She is morbidly obese. She is not ill-appearing or toxic-appearing.  HENT:     Head: Normocephalic and atraumatic.     Nose:     Comments: Lizbeth Bark is in place, covering mouth and nose. Eyes:     General: Lids are normal. Vision grossly intact.        Right eye: No discharge.        Left eye: No discharge.     Extraocular Movements: Extraocular movements intact.     Conjunctiva/sclera: Conjunctivae normal.     Pupils: Pupils are equal, round, and reactive to light.  Cardiovascular:     Pulses: Normal pulses.  Pulmonary:     Effort: Pulmonary effort is normal. No respiratory distress.  Musculoskeletal:     Right lower leg: No edema.     Left lower leg: No edema.  Skin:    General: Skin is warm and dry.     Capillary Refill: Capillary refill takes less than 2 seconds.  Neurological:  General: No focal deficit present.     Mental Status: She is alert and oriented to person, place, and time.  Psychiatric:        Attention and Perception: Attention and perception normal.        Mood and Affect: Mood and affect normal.        Speech: Speech normal.        Behavior: Behavior normal. Behavior is cooperative.        Thought Content: Thought content normal.        Cognition and Memory: Cognition and memory normal.        Judgment: Judgment normal.    Results for orders placed or performed in visit on 12/18/19  CBC with Differential  Result Value Ref Range   WBC 7.6 3.8 - 10.8 Thousand/uL   RBC 4.68 3.80 - 5.10 Million/uL   Hemoglobin 9.7 (L) 11.7 - 15.5 g/dL   HCT 33.9 (L) 35 - 45 %   MCV 72.4 (L) 80.0 - 100.0 fL   MCH 20.7 (L) 27.0 - 33.0 pg   MCHC 28.6 (L) 32.0 - 36.0 g/dL   RDW 15.0  11.0 - 15.0 %   Platelets 382 140 - 400 Thousand/uL   MPV 11.6 7.5 - 12.5 fL   Neutro Abs 5,016 1,500 - 7,800 cells/uL   Lymphs Abs 2,037 850 - 3,900 cells/uL   Absolute Monocytes 380 200 - 950 cells/uL   Eosinophils Absolute 99 15 - 500 cells/uL   Basophils Absolute 68 0 - 200 cells/uL   Neutrophils Relative % 66 %   Total Lymphocyte 26.8 %   Monocytes Relative 5.0 %   Eosinophils Relative 1.3 %   Basophils Relative 0.9 %  COMPLETE METABOLIC PANEL WITH GFR  Result Value Ref Range   Glucose, Bld 237 (H) 65 - 99 mg/dL   BUN 9 7 - 25 mg/dL   Creat 0.86 0.50 - 1.10 mg/dL   GFR, Est Non African American 90 > OR = 60 mL/min/1.62m   GFR, Est African American 104 > OR = 60 mL/min/1.756m  BUN/Creatinine Ratio NOT APPLICABLE 6 - 22 (calc)   Sodium 136 135 - 146 mmol/L   Potassium 4.4 3.5 - 5.3 mmol/L   Chloride 99 98 - 110 mmol/L   CO2 26 20 - 32 mmol/L   Calcium 9.9 8.6 - 10.2 mg/dL   Total Protein 8.1 6.1 - 8.1 g/dL   Albumin 4.3 3.6 - 5.1 g/dL   Globulin 3.8 (H) 1.9 - 3.7 g/dL (calc)   AG Ratio 1.1 1.0 - 2.5 (calc)   Total Bilirubin 0.7 0.2 - 1.2 mg/dL   Alkaline phosphatase (APISO) 75 31 - 125 U/L   AST 38 (H) 10 - 30 U/L   ALT 26 6 - 29 U/L  Thyroid Panel With TSH  Result Value Ref Range   T3 Uptake 30 22 - 35 %   T4, Total 9.3 5.1 - 11.9 mcg/dL   Free Thyroxine Index 2.8 1.4 - 3.8   TSH 1.36 mIU/L  Lipid Profile  Result Value Ref Range   Cholesterol 162 <200 mg/dL   HDL 33 (L) > OR = 50 mg/dL   Triglycerides 187 (H) <150 mg/dL   LDL Cholesterol (Calc) 101 (H) mg/dL (calc)   Total CHOL/HDL Ratio 4.9 <5.0 (calc)   Non-HDL Cholesterol (Calc) 129 <130 mg/dL (calc)  Hepatitis C antibody  Result Value Ref Range   Hepatitis C Ab NON-REACTIVE NON-REACTI   SIGNAL TO CUT-OFF 0.02 <1.00  Hemoglobin A1C w/out eAG  Result Value Ref Range   Hgb A1c MFr Bld 9.4 (H) <5.7 % of total Hgb      Assessment & Plan:   Problem List Items Addressed This Visit      Endocrine   Type  2 diabetes mellitus with hyperglycemia, without long-term current use of insulin ([place]) - Primary    New onset diagnosis of diabetes.  A1C on 12/18/2019 at 9.4%.  Family history of diabetes and personal history of gestational diabetes.  Time spent discussing diabetes education, food choices, checking CBG and logging these, as well as how to take metformin.  Discussed reducing ASCVD risk with low dose atorvastatin and working towards kidney protection with low dose ACEi.  Patient in agreement with treatment plan.  Plan: 1. Begin metformin 528m with breakfast x 7 days then increase to twice daily with meals 2. Begin atorvastatin 181mdaily at dinnertime for ASCVD reduction 3. Begin lisinopril 90m490maily to help with kidney protection 4. Check AM CBG and log these.  Bring log back to next appointment in 3 months 5. Encouraged to schedule DM eye exam, local list of providers given 6. RTC in 3 months      Relevant Medications   lisinopril (ZESTRIL) 5 MG tablet   atorvastatin (LIPITOR) 10 MG tablet   metFORMIN (GLUCOPHAGE) 500 MG tablet   blood glucose meter kit and supplies   Other Relevant Orders   POCT HgB A1C      Meds ordered this encounter  Medications  . lisinopril (ZESTRIL) 5 MG tablet    Sig: Take 1 tablet (5 mg total) by mouth daily.    Dispense:  90 tablet    Refill:  3  . atorvastatin (LIPITOR) 10 MG tablet    Sig: Take 1 tablet (10 mg total) by mouth daily.    Dispense:  90 tablet    Refill:  3  . metFORMIN (GLUCOPHAGE) 500 MG tablet    Sig: Take 1 tablet (500 mg total) by mouth daily with breakfast for 7 days, THEN 1 tablet (500 mg total) 2 (two) times daily with a meal.    Dispense:  187 tablet    Refill:  0  . blood glucose meter kit and supplies    Sig: Dispense based on patient and insurance preference. Use up to four times daily as directed. (FOR ICD-10 E10.9, E11.9).    Dispense:  1 each    Refill:  0    Order Specific Question:   Number of strips    Answer:   300      Order Specific Question:   Number of lancets    Answer:   300      Follow up plan: Return in about 3 months (around 03/26/2020) for DM follow up visit and A1C.   NicHarlin RainNPMonticellomily Nurse Practitioner [place] 12/25/2019, 3:22 PM

## 2020-01-08 ENCOUNTER — Encounter: Payer: No Typology Code available for payment source | Admitting: Family Medicine

## 2020-01-15 ENCOUNTER — Other Ambulatory Visit: Payer: Self-pay

## 2020-01-15 ENCOUNTER — Encounter: Payer: Self-pay | Admitting: Family Medicine

## 2020-01-15 ENCOUNTER — Ambulatory Visit (INDEPENDENT_AMBULATORY_CARE_PROVIDER_SITE_OTHER): Payer: No Typology Code available for payment source | Admitting: Family Medicine

## 2020-01-15 ENCOUNTER — Other Ambulatory Visit (HOSPITAL_COMMUNITY)
Admission: RE | Admit: 2020-01-15 | Discharge: 2020-01-15 | Disposition: A | Discharge status: Home / Self Care | Payer: No Typology Code available for payment source | Source: Ambulatory Visit | Attending: Family Medicine | Admitting: Family Medicine

## 2020-01-15 VITALS — BP 130/84 | HR 95 | Temp 98.4°F | Ht 61.5 in | Wt 231.2 lb

## 2020-01-15 DIAGNOSIS — Z124 Encounter for screening for malignant neoplasm of cervix: Secondary | ICD-10-CM

## 2020-01-15 DIAGNOSIS — R0683 Snoring: Secondary | ICD-10-CM

## 2020-01-15 DIAGNOSIS — Z0000 Encounter for general adult medical examination without abnormal findings: Secondary | ICD-10-CM

## 2020-01-15 DIAGNOSIS — R400 Somnolence: Secondary | ICD-10-CM

## 2020-01-15 DIAGNOSIS — E1165 Type 2 diabetes mellitus with hyperglycemia: Secondary | ICD-10-CM

## 2020-01-15 DIAGNOSIS — Z6841 Body Mass Index (BMI) 40.0 and over, adult: Secondary | ICD-10-CM

## 2020-01-15 NOTE — Assessment & Plan Note (Signed)
Last PAP testing 08/09/2016.  Result negative and did not meet criteria to reflex for HPV cotesting.  Due for PAP testing.  Plan: 1. PAP testing completed and sent to lab for evaluation

## 2020-01-15 NOTE — Assessment & Plan Note (Signed)
Reviewed concerns for OSA, sleep study was ordered in the past, has not yet been completed.  Will re-order today.  Plan: 1. Sleep study ordered

## 2020-01-15 NOTE — Progress Notes (Addendum)
Subjective:    Patient ID: Lisa Richard, female    DOB: 11/07/1985, 34 y.o.   MRN: [id number]  Lisa Richard is a 34 y.o. female presenting on 01/15/2020 for Gynecologic Exam   HPI  HEALTH MAINTENANCE:  Weight/BMI: Morbid obesity, BMI 42.98% Physical activity: Stays active Diet: Low glycemic Seatbelt: Always Sunscreen: No PAP: Due, completed today HIV & Hep C Screening: Completed 12/18/2019 (Hep C screening), HIV Screening negative 10/31/2016 GC/CT: Offered and declined Optometry: Will schedule Dentistry: Regularly  IMMUNIZATIONS: Influenza: Due this season Tetanus: Up to date, 11/14/2016 COVID: Discussed  STOP BANG SCREENING Snoring: Do you snore loudly? yes/no: Yes Tired: Do you often feel tired, fatigued, sleeping during the daytime? yes/no: Yes Observed: Has anyone observed you stop breathing or choking/gasping during your sleep? yes/no: Yes Pressure: Do you have or being treated for high blood pressure? yes/no: Yes BMI: Greater than 35? yes/no: Yes Age: Older than 30? yes/no: No Neck Side: Greater than 17 (males)/16 (females)? yes/no: No Gender: Born as female gender? yes/no: No  Depression screen [facility] 2/9 12/18/2019 11/13/2016  Decreased Interest 3 0  Down, Depressed, Hopeless 2 0  PHQ - 2 Score 5 0  Altered sleeping 3 -  Tired, decreased energy 3 -  Change in appetite 3 -  Feeling bad or failure about yourself  1 -  Trouble concentrating 1 -  Moving slowly or fidgety/restless 0 -  Suicidal thoughts 1 -  PHQ-9 Score 17 -  Difficult doing work/chores Somewhat difficult -    Past Medical History:  Diagnosis Date  . Anemia   . Diabetes mellitus without complication (HCC)    gestational  . Hypertension   . Obesity    Past Surgical History:  Procedure Laterality Date  . CESAREAN SECTION      x 2  . CESAREAN SECTION N/A 02/20/2016   Procedure: CESAREAN SECTION;  Surgeon: Will Bonnet, MD;  Location: [facility] ORS;  Service: Obstetrics;  Laterality:  N/A;  . CESAREAN SECTION N/A 01/15/2017   Procedure: CESAREAN SECTION;  Surgeon: Gae Dry, MD;  Location: [facility] ORS;  Service: Obstetrics;  Laterality: N/A;  . DILATION AND CURETTAGE OF UTERUS  2009   Missed AB   Social History   Socioeconomic History  . Marital status: Single    Spouse name: Not on file  . Number of children: Not on file  . Years of education: Not on file  . Highest education level: Not on file  Occupational History  . Not on file  Tobacco Use  . Smoking status: Current Every Day Smoker    Types: Cigarettes    Last attempt to quit: 06/16/2016    Years since quitting: 3.5  . Smokeless tobacco: Never Used  Vaping Use  . Vaping Use: Never used  Substance and Sexual Activity  . Alcohol use: No  . Drug use: No  . Sexual activity: Not Currently    Birth control/protection: None  Other Topics Concern  . Not on file  Social History Narrative  . Not on file   Social Determinants of Health   Financial Resource Strain:   . Difficulty of Paying Living Expenses: Not on file  Food Insecurity:   . Worried About Charity fundraiser in the Last Year: Not on file  . Ran Out of Food in the Last Year: Not on file  Transportation Needs:   . Lack of Transportation (Medical): Not on file  . Lack of Transportation (Non-Medical): Not on file  Physical Activity:   . Days of Exercise per Week: Not on file  . Minutes of Exercise per Session: Not on file  Stress:   . Feeling of Stress : Not on file  Social Connections:   . Frequency of Communication with Friends and Family: Not on file  . Frequency of Social Gatherings with Friends and Family: Not on file  . Attends Religious Services: Not on file  . Active Member of Clubs or Organizations: Not on file  . Attends Archivist Meetings: Not on file  . Marital Status: Not on file  Intimate Partner Violence:   . Fear of Current or Ex-Partner: Not on file  . Emotionally Abused: Not on file  . Physically  Abused: Not on file  . Sexually Abused: Not on file   Family History  Problem Relation Age of Onset  . Depression Mother   . Prostate cancer Father    Current Outpatient Medications on File Prior to Visit  Medication Sig  . atorvastatin (LIPITOR) 10 MG tablet Take 1 tablet (10 mg total) by mouth daily.  . blood glucose meter kit and supplies Dispense based on patient and insurance preference. Use up to four times daily as directed. (FOR ICD-10 E10.9, E11.9).  . hydrOXYzine (ATARAX/VISTARIL) 10 MG tablet Take 1 tablet (10 mg total) by mouth at bedtime as needed.  Marland Kitchen lisinopril (ZESTRIL) 5 MG tablet Take 1 tablet (5 mg total) by mouth daily.  . metFORMIN (GLUCOPHAGE) 500 MG tablet Take 1 tablet (500 mg total) by mouth daily with breakfast for 7 days, THEN 1 tablet (500 mg total) 2 (two) times daily with a meal.   No current facility-administered medications on file prior to visit.    Per HPI unless specifically indicated above     Objective:    BP 130/84 (BP Location: Left Arm, Patient Position: Sitting, Cuff Size: Large)   Pulse 95   Temp 98.4 F (36.9 C) (Oral)   Ht 5' 1.5" (1.562 m)   Wt 231 lb 3.2 oz (104.9 kg)   BMI 42.98 kg/m   Wt Readings from Last 3 Encounters:  01/15/20 231 lb 3.2 oz (104.9 kg)  12/25/19 (!) 233 lb 10.2 oz (106 kg)  12/18/19 (!) 234 lb 6.4 oz (106.3 kg)   Physical Exam Vitals reviewed.  Constitutional:      General: She is not in acute distress.    Appearance: Normal appearance. She is well-developed and well-groomed. She is morbidly obese. She is not ill-appearing or toxic-appearing.  HENT:     Head: Normocephalic and atraumatic.     Right Ear: Tympanic membrane, ear canal and external ear normal. There is no impacted cerumen.     Left Ear: Tympanic membrane, ear canal and external ear normal. There is no impacted cerumen.     Nose: Nose normal. No congestion or rhinorrhea.     Comments: Lizbeth Bark is in place, covering mouth and nose.     Mouth/Throat:     Lips: Pink.     Mouth: Mucous membranes are moist.     Pharynx: Oropharynx is clear. Uvula midline. No oropharyngeal exudate or posterior oropharyngeal erythema.  Eyes:     General: Lids are normal. Vision grossly intact. No scleral icterus.       Right eye: No discharge.        Left eye: No discharge.     Extraocular Movements: Extraocular movements intact.     Conjunctiva/sclera: Conjunctivae normal.     Pupils: Pupils are  equal, round, and reactive to light.  Neck:     Thyroid: No thyroid mass or thyromegaly.  Cardiovascular:     Rate and Rhythm: Normal rate and regular rhythm.     Pulses: Normal pulses.          Dorsalis pedis pulses are 2+ on the right side and 2+ on the left side.     Heart sounds: Normal heart sounds. No murmur heard.  No friction rub. No gallop.   Pulmonary:     Effort: Pulmonary effort is normal. No respiratory distress.     Breath sounds: Normal breath sounds.  Abdominal:     General: Abdomen is flat. Bowel sounds are normal. There is no distension.     Palpations: Abdomen is soft. There is no hepatomegaly, splenomegaly or mass.     Tenderness: There is no abdominal tenderness. There is no guarding or rebound.     Hernia: No hernia is present. There is no hernia in the left inguinal area or right inguinal area.  Genitourinary:    General: Normal vulva.     Exam position: Lithotomy position.     Pubic Area: No rash or pubic lice.      Labia:        Right: No rash, tenderness, lesion or injury.        Left: No rash, tenderness, lesion or injury.      Urethra: No urethral pain, urethral swelling or urethral lesion.     Vagina: Normal. No signs of injury and foreign body. No vaginal discharge, erythema, tenderness, bleeding or lesions.     Cervix: No cervical motion tenderness, discharge, friability, lesion, erythema, cervical bleeding or eversion.     Uterus: Normal. Not enlarged and not tender.      Adnexa: Right adnexa normal and left  adnexa normal.  Musculoskeletal:        General: Normal range of motion.     Cervical back: Normal range of motion and neck supple. No tenderness.     Right lower leg: No edema.     Left lower leg: No edema.     Comments: Normal tone, strength 5/5 BUE & BLE  Feet:     Right foot:     Skin integrity: Skin integrity normal.     Left foot:     Skin integrity: Skin integrity normal.  Lymphadenopathy:     Cervical: No cervical adenopathy.     Lower Body: No right inguinal adenopathy. No left inguinal adenopathy.  Skin:    General: Skin is warm and dry.     Capillary Refill: Capillary refill takes less than 2 seconds.  Neurological:     General: No focal deficit present.     Mental Status: She is alert and oriented to person, place, and time.     Cranial Nerves: No cranial nerve deficit.     Sensory: No sensory deficit.     Motor: No weakness.     Coordination: Coordination normal.     Gait: Gait normal.     Deep Tendon Reflexes: Reflexes normal.  Psychiatric:        Attention and Perception: Attention and perception normal.        Mood and Affect: Mood and affect normal.        Speech: Speech normal.        Behavior: Behavior normal. Behavior is cooperative.        Thought Content: Thought content normal.  Cognition and Memory: Cognition and memory normal.        Judgment: Judgment normal.     Results for orders placed or performed in visit on 12/18/19  CBC with Differential  Result Value Ref Range   WBC 7.6 3.8 - 10.8 Thousand/uL   RBC 4.68 3.80 - 5.10 Million/uL   Hemoglobin 9.7 (L) 11.7 - 15.5 g/dL   HCT 33.9 (L) 35 - 45 %   MCV 72.4 (L) 80.0 - 100.0 fL   MCH 20.7 (L) 27.0 - 33.0 pg   MCHC 28.6 (L) 32.0 - 36.0 g/dL   RDW 15.0 11.0 - 15.0 %   Platelets 382 140 - 400 Thousand/uL   MPV 11.6 7.5 - 12.5 fL   Neutro Abs 5,016 1,500 - 7,800 cells/uL   Lymphs Abs 2,037 850 - 3,900 cells/uL   Absolute Monocytes 380 200 - 950 cells/uL   Eosinophils Absolute 99 15 - 500  cells/uL   Basophils Absolute 68 0 - 200 cells/uL   Neutrophils Relative % 66 %   Total Lymphocyte 26.8 %   Monocytes Relative 5.0 %   Eosinophils Relative 1.3 %   Basophils Relative 0.9 %  COMPLETE METABOLIC PANEL WITH GFR  Result Value Ref Range   Glucose, Bld 237 (H) 65 - 99 mg/dL   BUN 9 7 - 25 mg/dL   Creat 0.86 0.50 - 1.10 mg/dL   GFR, Est Non African American 90 > OR = 60 mL/min/1.62m   GFR, Est African American 104 > OR = 60 mL/min/1.715m  BUN/Creatinine Ratio NOT APPLICABLE 6 - 22 (calc)   Sodium 136 135 - 146 mmol/L   Potassium 4.4 3.5 - 5.3 mmol/L   Chloride 99 98 - 110 mmol/L   CO2 26 20 - 32 mmol/L   Calcium 9.9 8.6 - 10.2 mg/dL   Total Protein 8.1 6.1 - 8.1 g/dL   Albumin 4.3 3.6 - 5.1 g/dL   Globulin 3.8 (H) 1.9 - 3.7 g/dL (calc)   AG Ratio 1.1 1.0 - 2.5 (calc)   Total Bilirubin 0.7 0.2 - 1.2 mg/dL   Alkaline phosphatase (APISO) 75 31 - 125 U/L   AST 38 (H) 10 - 30 U/L   ALT 26 6 - 29 U/L  Thyroid Panel With TSH  Result Value Ref Range   T3 Uptake 30 22 - 35 %   T4, Total 9.3 5.1 - 11.9 mcg/dL   Free Thyroxine Index 2.8 1.4 - 3.8   TSH 1.36 mIU/L  Lipid Profile  Result Value Ref Range   Cholesterol 162 <200 mg/dL   HDL 33 (L) > OR = 50 mg/dL   Triglycerides 187 (H) <150 mg/dL   LDL Cholesterol (Calc) 101 (H) mg/dL (calc)   Total CHOL/HDL Ratio 4.9 <5.0 (calc)   Non-HDL Cholesterol (Calc) 129 <130 mg/dL (calc)  Hepatitis C antibody  Result Value Ref Range   Hepatitis C Ab NON-REACTIVE NON-REACTI   SIGNAL TO CUT-OFF 0.02 <1.00  Hemoglobin A1C w/out eAG  Result Value Ref Range   Hgb A1c MFr Bld 9.4 (H) <5.7 % of total Hgb      Assessment & Plan:   Problem List Items Addressed This Visit      Endocrine   Type 2 diabetes mellitus with hyperglycemia, without long-term current use of insulin (HCC)    Currently taking metformin twice daily with meals, atorvastatin 1054maily for ASCVD risk reduction and lisinopril 5mg61mily for kidney protection.  Is  due for re-evaluation in 2  months with updated A1C for evaluation of management.  Reports is tolerating metformin dosages.        Other   BMI 40.0-44.9, adult ([place])    Is currently following a low glycemic diet for her new diagnosis of T2DM from 11/2019.  Has had a 3lb weight loss since her last visit with [place] 3 weeks ago.  Plan: 1. Will re-evaluate dietary and lifestyle modifications secondary to T2DM diagnosis and metformin usage. 2. Weight check in 2 months      Snoring    Reviewed concerns for OSA, sleep study was ordered in the past, has not yet been completed.  Will re-order today.  Plan: 1. Sleep study ordered      Relevant Orders   Nocturnal polysomnography (NPSG)   Daytime somnolence    See snoring A/P      Annual physical exam - Primary    Annual physical exam without new findings.  Well adult with no acute concerns.  Plan: 1. Obtain health maintenance screenings as above according to age. - Increase physical activity to 30 minutes most days of the week.  - Eat healthy diet high in vegetables and fruits; low in refined carbohydrates. - Screening labs and tests as ordered 2. Return 1 year for annual physical.       Screening for cervical cancer    Last PAP testing 08/09/2016.  Result negative and did not meet criteria to reflex for HPV cotesting.  Due for PAP testing.  Plan: 1. PAP testing completed and sent to lab for evaluation       Relevant Orders   Cytology - PAP      No orders of the defined types were placed in this encounter.     Follow up plan: Return in about 2 months (around 03/16/2020) for DM, HTN F/U visit.  Harlin Rain, FNP-C Family Nurse Practitioner [facility] 01/15/2020, 11:38 AM

## 2020-01-15 NOTE — Patient Instructions (Signed)
We have sent your PAP to the lab for testing.  Will contact you once the results are received.  Well Visit: Care Instructions Overview  Well visits can help you stay healthy. Your provider has checked your overall health and may have suggested ways to take good care of yourself. Your provider also may have recommended tests. At home, you can help prevent illness with healthy eating, regular exercise, and other steps.  Follow-up care is a key part of your treatment and safety. Be sure to make and go to all appointments, and call your provider if you are having problems. It's also a good idea to know your test results and keep a list of the medicines you take.  How can you care for yourself at home?   Get screening tests that you and your doctor decide on. Screening helps find diseases before any symptoms appear.   Eat healthy foods. Choose fruits, vegetables, whole grains, protein, and low-fat dairy foods. Limit fat, especially saturated fat. Reduce salt in your diet.   Limit alcohol. If you are a man, have no more than 2 drinks a day or 14 drinks a week. If you are a woman, have no more than 1 drink a day or 7 drinks a week.   Get at least 30 minutes of physical activity on most days of the week.  We recommend you go no more than 2 days in a row without exercise. Walking is a good choice. You also may want to do other activities, such as running, swimming, cycling, or playing tennis or team sports. Discuss any changes in your exercise program with your provider.   Reach and stay at a healthy weight. This will lower your risk for many problems, such as obesity, diabetes, heart disease, and high blood pressure.   Do not smoke or allow others to smoke around you. If you need help quitting, talk to your provider about stop-smoking programs and medicines. These can increase your chances of quitting for good.  Can call 1-800-QUIT-NOW (1-800-784-8669) for the [place] Quitline, assistance  with smoking cessation.   Care for your mental health. It is easy to get weighed down by worry and stress. Learn strategies to manage stress, like deep breathing and mindfulness, and stay connected with your family and community. If you find you often feel sad or hopeless, talk with your provider. Treatment can help.   Talk to your provider about whether you have any risk factors for sexually transmitted infections (STIs). You can help prevent STIs if you wait to have sex with a new partner (or partners) until you've each been tested for STIs. It also helps if you use condoms (female or female condoms) and if you limit your sex partners to one person who only has sex with you. Vaccines are available for some STIs, such as HPV (these are age dependent).   Use birth control if it's important to you to prevent pregnancy. Talk with your provider about the choices available and what might be best for you.   If you think you may have a problem with alcohol or drug use, talk to your provider. This includes prescription medicines (such as amphetamines and opioids) and illegal drugs (such as cocaine and methamphetamine). Your provider can help you figure out what type of treatment is best for you.   If you have concerns about domestic violence or intimate partner violence, there are resources available to you. National Domestic Abuse Hotline 1-800-7233   Protect your skin   from too much sun. When you're outdoors from 10 a.m. to 4 p.m., stay in the shade or cover up with clothing and a hat with a wide brim. Wear sunglasses that block UV rays. Even when it's cloudy, put broad-spectrum sunscreen (SPF 30 or higher) on any exposed skin.   See a dentist one or two times a year for checkups and to have your teeth cleaned.   See an eye doctor once per year for an eye exam.   Wear a seat belt in the car.  When should you call for help?  Watch closely for changes in your health, and be sure to contact your  provider if you have any problems or symptoms that concern you.  We will plan to see you back in 2 months for hypertension and diabetes follow up visit  You will receive a survey after today's visit either digitally by e-mail or paper by USPS mail. Your experiences and feedback matter to [place].  Please respond so we know how we are doing as we provide care for you.  Call us with any questions/concerns/needs.  It is my goal to be available to you for your health concerns.  Thanks for choosing me to be a partner in your healthcare needs!  Charlaine Dalton, FNP-C Family Nurse Practitioner [facility] Phone: [phone]

## 2020-01-15 NOTE — Assessment & Plan Note (Signed)
Currently taking metformin twice daily with meals, atorvastatin 10mg  daily for ASCVD risk reduction and lisinopril 5mg  daily for kidney protection.  Is due for re-evaluation in 2 months with updated A1C for evaluation of management.  Reports is tolerating metformin dosages.

## 2020-01-15 NOTE — Assessment & Plan Note (Signed)
See snoring A/P 

## 2020-01-15 NOTE — Assessment & Plan Note (Signed)
Is currently following a low glycemic diet for her new diagnosis of T2DM from 11/2019.  Has had a 3lb weight loss since her last visit with [place] 3 weeks ago.  Plan: 1. Will re-evaluate dietary and lifestyle modifications secondary to T2DM diagnosis and metformin usage. 2. Weight check in 2 months

## 2020-01-15 NOTE — Assessment & Plan Note (Signed)
Annual physical exam without new findings.  Well adult with no acute concerns.  Plan: 1. Obtain health maintenance screenings as above according to age. - Increase physical activity to 30 minutes most days of the week.  - Eat healthy diet high in vegetables and fruits; low in refined carbohydrates. - Screening labs and tests as ordered 2. Return 1 year for annual physical.  

## 2020-01-20 LAB — CYTOLOGY - PAP
Adequacy: ABSENT
Comment: NEGATIVE
Diagnosis: NEGATIVE
High risk HPV: NEGATIVE

## 2020-03-18 ENCOUNTER — Ambulatory Visit: Payer: No Typology Code available for payment source | Admitting: Family Medicine

## 2020-04-01 ENCOUNTER — Telehealth: Payer: Self-pay

## 2020-04-01 ENCOUNTER — Ambulatory Visit: Payer: No Typology Code available for payment source | Admitting: Family Medicine

## 2020-04-01 NOTE — Telephone Encounter (Signed)
Copied from CRM [id number]. Topic: Quick Communication - Appointment Cancellation >> Apr 01, 2020  1:21 PM Leafy Ro wrote: Patient called to cancel appointment scheduled for [facility]. Patient HAS  rescheduled their appointment. Pt has a headache  Route to department's PEC pool.

## 2020-04-08 ENCOUNTER — Other Ambulatory Visit: Payer: Self-pay

## 2020-04-08 ENCOUNTER — Encounter: Payer: Self-pay | Admitting: Family Medicine

## 2020-04-08 ENCOUNTER — Telehealth (INDEPENDENT_AMBULATORY_CARE_PROVIDER_SITE_OTHER): Payer: No Typology Code available for payment source | Admitting: Family Medicine

## 2020-04-08 VITALS — Ht 61.5 in | Wt 228.0 lb

## 2020-04-08 DIAGNOSIS — E1165 Type 2 diabetes mellitus with hyperglycemia: Secondary | ICD-10-CM

## 2020-04-08 NOTE — Assessment & Plan Note (Signed)
Unknown status of diabetes, will need updated A1C for re-evaluation.  Will stop metformin due to GI side effects, encouraged to RTC within the next week so we can have her A1C reassessed and start on a new medication.  Patient agreeable to plan.

## 2020-04-08 NOTE — Progress Notes (Signed)
Virtual Visit via Telephone  The purpose of this virtual visit is to provide medical care while limiting exposure to the novel coronavirus (COVID19) for both patient and office staff.  Consent was obtained for phone visit:  Yes.   Answered questions that patient had about telehealth interaction:  Yes.   I discussed the limitations, risks, security and privacy concerns of performing an evaluation and management service by telephone. I also discussed with the patient that there may be a patient responsible charge related to this service. The patient expressed understanding and agreed to proceed.  Patient is at home and is accessed via telephone Services are provided by Harlin Rain, FNP-C from [facility])  ---------------------------------------------------------------------- Chief Complaint  Patient presents with  . Diabetes    S: Reviewed CMA documentation. I have called patient and gathered additional HPI as follows:  Ms. Lisa Richard presents for virtual telemedicine visit via telephone, reports that she has been having an upset stomach with taking her metformin 563m twice daily, is requesting to stop this medication.  Has taken her CBG last approx > 3 weeks ago and reports ranges were high 100's-200's.  Diabetes -Current diabetic medications include: Metformin 10061mBID -ACTION; IS/IS NOT: is not currently symptomatic -Actions; denies/reports/admits to: denies polydipsia, polyphagia, polyuria, headaches, diaphoresis, shakiness, chills, pain, numbness or tingling in extremities or changes in vision -Clinical course has been unknown -Reports no exercise routine -Diet is high in salt, high in fat, and high in carbohydrates  PREVENTION Eye exam current (within 1 year) Due, encouraged Foot exam current (within 1 year) Due, encouraged Lipid/ASCVD risk reduction - on statin: YES/NO: Yes  Kidney Protection (On ACE/ARB)? YES/NO: Yes   Patient is currently  honme Denies any high risk travel to areas of current concern for COVID19. Denies any known or suspected exposure to person with or possibly with COVID19.  Past Medical History:  Diagnosis Date  . Anemia   . Diabetes mellitus without complication (HCC)    gestational  . Hypertension   . Obesity    Social History   Tobacco Use  . Smoking status: Current Every Day Smoker    Types: Cigarettes    Last attempt to quit: 06/16/2016    Years since quitting: 3.8  . Smokeless tobacco: Never Used  Vaping Use  . Vaping Use: Never used  Substance Use Topics  . Alcohol use: No  . Drug use: No    Current Outpatient Medications:  .  atorvastatin (LIPITOR) 10 MG tablet, Take 1 tablet (10 mg total) by mouth daily., Disp: 90 tablet, Rfl: 3 .  blood glucose meter kit and supplies, Dispense based on patient and insurance preference. Use up to four times daily as directed. (FOR ICD-10 E10.9, E11.9)., Disp: 1 each, Rfl: 0 .  hydrOXYzine (ATARAX/VISTARIL) 10 MG tablet, Take 1 tablet (10 mg total) by mouth at bedtime as needed., Disp: 30 tablet, Rfl: 0 .  lisinopril (ZESTRIL) 5 MG tablet, Take 1 tablet (5 mg total) by mouth daily., Disp: 90 tablet, Rfl: 3 .  metFORMIN (GLUCOPHAGE) 500 MG tablet, Take 1 tablet (500 mg total) by mouth daily with breakfast for 7 days, THEN 1 tablet (500 mg total) 2 (two) times daily with a meal., Disp: 187 tablet, Rfl: 0  [facility] 04/08/2020 12/18/2019 11/13/2016  Decreased Interest 0 3 0  Down, Depressed, Hopeless 0 2 0  PHQ - 2 Score 0 5 0  Altered sleeping 0 3 -  Tired, decreased energy 0  3 -  Change in appetite 0 3 -  Feeling bad or failure about yourself  0 1 -  Trouble concentrating 0 1 -  Moving slowly or fidgety/restless 0 0 -  Suicidal thoughts 0 1 -  PHQ-9 Score 0 17 -  Difficult doing work/chores Not difficult at all Somewhat difficult -    GAD 7 : Generalized Anxiety Score 04/08/2020 12/18/2019  Nervous, Anxious, on Edge 0 1   Control/stop worrying 0 3  Worry too much - different things 0 3  Trouble relaxing 0 3  Restless 0 3  Easily annoyed or irritable 0 3  Afraid - awful might happen 0 0  Total GAD 7 Score 0 16  Anxiety Difficulty Not difficult at all Not difficult at all    -------------------------------------------------------------------------- O: No physical exam performed due to remote telephone encounter.  Physical Exam: Patient remotely monitored without video.  Verbal communication appropriate.  Cognition normal.  Recent Results (from the past 2160 hour(s))  Cytology - PAP     Status: None   Collection Time: 01/15/20 11:08 AM  Result Value Ref Range   High risk HPV Negative    Adequacy      Satisfactory for evaluation; transformation zone component ABSENT.   Diagnosis      - Negative for intraepithelial lesion or malignancy (NILM)   Microorganisms Shift in flora suggestive of bacterial vaginosis    Comment Normal Reference Range HPV - Negative     -------------------------------------------------------------------------- A&P:  Problem List Items Addressed This Visit      Endocrine   Type 2 diabetes mellitus with hyperglycemia, without long-term current use of insulin ([place]) - Primary    Unknown status of diabetes, will need updated A1C for re-evaluation.  Will stop metformin due to GI side effects, encouraged to RTC within the next week so we can have her A1C reassessed and start on a new medication.  Patient agreeable to plan.           No orders of the defined types were placed in this encounter.   Follow-up: - Return in 1 week for labs and updated diabetes treatment plan  Patient verbalizes understanding with the above medical recommendations including the limitation of remote medical advice.  Specific follow-up and call-back criteria were given for patient to follow-up or seek medical care more urgently if needed.  - Time spent in direct consultation with patient on  phone: 6 minutes  Harlin Rain, [facility] 04/08/2020, 9:12 AM

## 2020-04-29 ENCOUNTER — Other Ambulatory Visit: Payer: Self-pay

## 2020-04-29 ENCOUNTER — Ambulatory Visit (INDEPENDENT_AMBULATORY_CARE_PROVIDER_SITE_OTHER): Payer: No Typology Code available for payment source | Admitting: Family Medicine

## 2020-04-29 ENCOUNTER — Encounter: Payer: Self-pay | Admitting: Family Medicine

## 2020-04-29 VITALS — BP 116/62 | HR 87 | Temp 98.3°F | Resp 17 | Ht 61.5 in | Wt 231.8 lb

## 2020-04-29 DIAGNOSIS — E1165 Type 2 diabetes mellitus with hyperglycemia: Secondary | ICD-10-CM

## 2020-04-29 LAB — POCT GLYCOSYLATED HEMOGLOBIN (HGB A1C): Hemoglobin A1C: 8.6 % — AB (ref 4.0–5.6)

## 2020-04-29 MED ORDER — LISINOPRIL 5 MG PO TABS: 5.0000 mg | ORAL_TABLET | Freq: Every day | ORAL | 3 refills | Status: DC

## 2020-04-29 MED ORDER — EMPAGLIFLOZIN 25 MG PO TABS: 25.0000 mg | ORAL_TABLET | Freq: Every day | ORAL | 1 refills | Status: DC

## 2020-04-29 MED ORDER — ATORVASTATIN CALCIUM 10 MG PO TABS: 10.0000 mg | ORAL_TABLET | Freq: Every day | ORAL | 3 refills | Status: DC

## 2020-04-29 NOTE — Progress Notes (Signed)
Subjective:    Patient ID: Lisa Richard, female    DOB: 1985-08-05, 34 y.o.   MRN: [id number]  Lisa Richard is a 34 y.o. female presenting on 04/29/2020 for Diabetes   HPI   Diabetes Pt presents today for follow up Type 2 Diabetes Mellitus.  He/she (caps): She ACTION; IS/IS NOT: is not checking AM CBG at home. -Current diabetic medications include: metformin 500mg  BID WC -ACTION; IS/IS NOT: is not currently symptomatic -Actions; denies/reports/admits to: denies polydipsia, polyphagia, polyuria, headaches, diaphoresis, shakiness, chills, pain, numbness or tingling in extremities or changes in vision -Clinical course has been improving -Reports no exercise routine -Diet is high in salt, high in fat, and high in carbohydrates  PREVENTION Eye exam current (within 1 year) Due, encouraged Foot exam current (within 1 year) Due, will complete at next visit Lipid/ASCVD risk reduction - on statin: YES/NO: Yes  Kidney Protection (On ACE/ARB)? YES/NO: Yes   Depression screen [facility] 2/9 04/08/2020 12/18/2019 11/13/2016  Decreased Interest 0 3 0  Down, Depressed, Hopeless 0 2 0  PHQ - 2 Score 0 5 0  Altered sleeping 0 3 -  Tired, decreased energy 0 3 -  Change in appetite 0 3 -  Feeling bad or failure about yourself  0 1 -  Trouble concentrating 0 1 -  Moving slowly or fidgety/restless 0 0 -  Suicidal thoughts 0 1 -  PHQ-9 Score 0 17 -  Difficult doing work/chores Not difficult at all Somewhat difficult -    Social History   Tobacco Use  . Smoking status: Current Some Day Smoker    Types: Cigarettes    Last attempt to quit: 06/16/2016    Years since quitting: 3.8  . Smokeless tobacco: Never Used  Vaping Use  . Vaping Use: Never used  Substance Use Topics  . Alcohol use: No  . Drug use: No    Review of Systems  Constitutional: Negative.   HENT: Negative.   Eyes: Negative.   Respiratory: Negative.   Cardiovascular: Negative.   Gastrointestinal: Negative.   Endocrine:  Negative.   Genitourinary: Negative.   Musculoskeletal: Negative.   Skin: Negative.   Allergic/Immunologic: Negative.   Neurological: Negative.   Hematological: Negative.   Psychiatric/Behavioral: Negative.    Per HPI unless specifically indicated above     Objective:    BP 116/62 (BP Location: Right Arm, Patient Position: Sitting, Cuff Size: Large)   Pulse 87   Temp 98.3 F (36.8 C) (Oral)   Resp 17   Ht 5' 1.5" (1.562 m)   Wt 231 lb 12.8 oz (105.1 kg)   SpO2 100%   BMI 43.09 kg/m   Wt Readings from Last 3 Encounters:  04/29/20 231 lb 12.8 oz (105.1 kg)  04/08/20 228 lb (103.4 kg)  01/15/20 231 lb 3.2 oz (104.9 kg)    Physical Exam Vitals and nursing note reviewed.  Constitutional:      General: She is not in acute distress.    Appearance: Normal appearance. She is well-developed and well-groomed. She is not ill-appearing or toxic-appearing.  HENT:     Head: Normocephalic and atraumatic.     Nose:     Comments: 01/17/20 is in place, covering mouth and nose. Eyes:     General: Lids are normal. Vision grossly intact.        Right eye: No discharge.        Left eye: No discharge.     Extraocular Movements: Extraocular movements intact.  Conjunctiva/sclera: Conjunctivae normal.     Pupils: Pupils are equal, round, and reactive to light.  Cardiovascular:     Rate and Rhythm: Normal rate and regular rhythm.     Pulses: Normal pulses.     Heart sounds: Normal heart sounds. No murmur heard.  No friction rub. No gallop.   Pulmonary:     Effort: Pulmonary effort is normal. No respiratory distress.     Breath sounds: Normal breath sounds.  Skin:    General: Skin is warm and dry.     Capillary Refill: Capillary refill takes less than 2 seconds.  Neurological:     General: No focal deficit present.     Mental Status: She is alert and oriented to person, place, and time.  Psychiatric:        Attention and Perception: Attention and perception normal.        Mood and  Affect: Mood and affect normal.        Speech: Speech normal.        Behavior: Behavior normal. Behavior is cooperative.        Thought Content: Thought content normal.        Cognition and Memory: Cognition and memory normal.        Judgment: Judgment normal.    Results for orders placed or performed in visit on 04/29/20  POCT glycosylated hemoglobin (Hb A1C)  Result Value Ref Range   Hemoglobin A1C 8.6 (A) 4.0 - 5.6 %   HbA1c POC (<> result, manual entry)     HbA1c, POC (prediabetic range)     HbA1c, POC (controlled diabetic range)        Assessment & Plan:   Problem List Items Addressed This Visit      Endocrine   Type 2 diabetes mellitus with hyperglycemia, without long-term current use of insulin (HCC) - Primary    UncontrolledDM with A1c 8.6% improved from 9.4% on 12/18/2019 and goal A1c < 7.0%.  Has stopped the metformin since her office visit, as well as the atorvastatin and lisinopril.  Reports the metformin was causing GI irritation and was unable to tolerate it.  Discussed GLP-1 medications, declines injectable medications.  In agreement to starting jardiance once daily, restarting on atorvastatin and lisinopril. - Complications - hyperglycemia.  Plan:  1. Change therapy: Stop metformin and start jardiance 25mg  daily.  RESTART atorvastatin 10mg  daily and lisinopril 5mg  daily.  Have labs drawn in 2-3 weeks after starting lisinopril. 2. Encourage improved lifestyle: - low carb/low glycemic diet reinforced prior education - Increase physical activity to 30 minutes most days of the week.  Explained that increased physical activity increases body's use of sugar for energy. 3. Check fasting am CBG and log these.  Bring log to next visit for review 4. Continue ACEi and Statin 5. Advised to schedule DM ophtho exam, send record. 6. Follow-up 3 months      Relevant Medications   empagliflozin (JARDIANCE) 25 MG TABS tablet   lisinopril (ZESTRIL) 5 MG tablet   atorvastatin  (LIPITOR) 10 MG tablet   Other Relevant Orders   POCT glycosylated hemoglobin (Hb A1C) (Completed)      Meds ordered this encounter  Medications  . empagliflozin (JARDIANCE) 25 MG TABS tablet    Sig: Take 1 tablet (25 mg total) by mouth daily before breakfast.    Dispense:  90 tablet    Refill:  1  . lisinopril (ZESTRIL) 5 MG tablet    Sig: Take 1 tablet (5 mg  total) by mouth daily.    Dispense:  90 tablet    Refill:  3  . atorvastatin (LIPITOR) 10 MG tablet    Sig: Take 1 tablet (10 mg total) by mouth daily.    Dispense:  90 tablet    Refill:  3   Follow up plan: Return in about 3 months (around 07/28/2020) for T2DM, A1C.   Charlaine [place], FNP Family Nurse Practitioner [facility] 04/29/2020, 2:01 PM

## 2020-04-29 NOTE — Patient Instructions (Signed)
Your provider would like to you have your annual eye exam. Please contact your current eye doctor or here are some good options for you to contact.   [facility]   Address: [address] [place], [place] [address] Phone: [phone]  Website: [email or web address]   [facility] [address] [place], [place] [address] Phone: [phone] [email or web address]  [facility]  Address: [address] [place], [place], [place] [address] Phone: [phone]   [facility] [address] [place], [place] [address] Phone: [phone]  [facility] Address: [address] [place], [place], [place] [address]  Phone: [phone]  You can learn more information online about your diabetes at American Diabetes Association: http://www.diabetes.org/ - General self-care (diet, medications, blood sugar checks). - Diet recommendations - There are even recipes available for you to look at and try.  I have stopped your metformin and started you on Jardiance 25mg  to take 1 tablet daily before breakfast  Take your blood glucose most days of the week and write them down  We will plan to see you back in 3 months for diabetes  You will receive a survey after today's visit either digitally by e-mail or paper by . Your experiences and feedback matter to [employer].  Please respond so we know how we are doing as we provide care for you.  Call us with any questions/concerns/needs.  It is my goal to be available to you for your health concerns.  Thanks for choosing me to be a partner in your healthcare needs!  [place], FNP-C Family Nurse Practitioner [facility] Phone: [phone]

## 2020-04-29 NOTE — Assessment & Plan Note (Signed)
UncontrolledDM with A1c 8.6% improved from 9.4% on 12/18/2019 and goal A1c < 7.0%.  Has stopped the metformin since her office visit, as well as the atorvastatin and lisinopril.  Reports the metformin was causing GI irritation and was unable to tolerate it.  Discussed GLP-1 medications, declines injectable medications.  In agreement to starting jardiance once daily, restarting on atorvastatin and lisinopril. - Complications - hyperglycemia.  Plan:  1. Change therapy: Stop metformin and start jardiance 25mg  daily.  RESTART atorvastatin 10mg  daily and lisinopril 5mg  daily.  Have labs drawn in 2-3 weeks after starting lisinopril. 2. Encourage improved lifestyle: - low carb/low glycemic diet reinforced prior education - Increase physical activity to 30 minutes most days of the week.  Explained that increased physical activity increases body's use of sugar for energy. 3. Check fasting am CBG and log these.  Bring log to next visit for review 4. Continue ACEi and Statin 5. Advised to schedule DM ophtho exam, send record. 6. Follow-up 3 months

## 2020-08-05 ENCOUNTER — Ambulatory Visit: Payer: No Typology Code available for payment source | Admitting: Family Medicine

## 2020-10-07 ENCOUNTER — Ambulatory Visit
Admission: EM | Admit: 2020-10-07 | Discharge: 2020-10-07 | Disposition: A | Discharge status: Home / Self Care | Payer: No Typology Code available for payment source

## 2020-10-07 ENCOUNTER — Encounter: Payer: Self-pay | Admitting: Emergency Medicine

## 2020-10-07 ENCOUNTER — Other Ambulatory Visit: Payer: Self-pay

## 2020-10-07 DIAGNOSIS — M5442 Lumbago with sciatica, left side: Secondary | ICD-10-CM | POA: Diagnosis not present

## 2020-10-07 MED ORDER — TIZANIDINE HCL 4 MG PO TABS: 4.0000 mg | ORAL_TABLET | Freq: Three times a day (TID) | ORAL | 0 refills | Status: AC | PRN

## 2020-10-07 MED ORDER — KETOROLAC TROMETHAMINE 60 MG/2ML IM SOLN
60.0000 mg | Freq: Once | INTRAMUSCULAR | Status: AC
  Administered 2020-10-07: 60 mg via INTRAMUSCULAR

## 2020-10-07 MED ORDER — NAPROXEN 500 MG PO TABS: 500.0000 mg | ORAL_TABLET | Freq: Two times a day (BID) | ORAL | 1 refills | Status: AC

## 2020-10-07 NOTE — ED Triage Notes (Signed)
Pt c/o lower back pain that radiates down her right thigh. Started about 5 days ago. She states she also has left knee pain that started about the same time. No known injuries. Denies urinary symptoms. She states the pain is worse when she stands up and walks.

## 2020-10-07 NOTE — ED Provider Notes (Signed)
[facility]    CSN: [id number] Arrival date & time: 10/07/20  1127      History   Chief Complaint Chief Complaint  Patient presents with  . Back Pain  . Knee Pain    left    HPI DESTANY SEVERNS is a 35 y.o. female presenting for approximately 5-day history of bilateral lower back pain with radiation of pain down the left posterior thigh to the knee.  Patient says the back pain is aching and the leg pain is sharp and shooting.  She admits to a similar but milder pain on the right posterior thigh that has been coming and going for "a while now."  Patient denies any specific injuries.  Pain is made worse with standing and walking and improves slightly with sitting.  She has not taken any medications to try to help with the pain.  She denies any numbness, tingling or weakness.  No saddle anesthesia or bowel or bladder incontinence.  Past medical history is significant for type 2 diabetes.  Patient admits that she does not take her medication like she is supposed to.  Also has history of hypertension.  No other concerns.  HPI  Past Medical History:  Diagnosis Date  . Anemia   . Diabetes mellitus without complication (HCC)    gestational  . Hypertension   . Obesity     Patient Active Problem List   Diagnosis Date Noted  . Annual physical exam 01/15/2020  . Screening for cervical cancer 01/15/2020  . Type 2 diabetes mellitus with hyperglycemia, without long-term current use of insulin ([place]) 12/25/2019  . Anemia 12/18/2019  . Weight gain 12/18/2019  . Elevated blood pressure reading 12/18/2019  . Dizziness 12/18/2019  . Anxiety 12/18/2019  . Insomnia 12/18/2019  . Snoring 12/18/2019  . Daytime somnolence 12/18/2019  . Postpartum hypertension 02/07/2017  . Cesarean delivery delivered 01/15/2017  . Gestational diabetes 11/06/2016  . Anemia affecting pregnancy in third trimester 10/12/2016  . History of maternal blood transfusion, currently pregnant 10/12/2016  .  Maternal atypical antibody complicating pregnancy 50/27/7412  . History of 3 cesarean sections 08/08/2016  . High risk pregnancy, antepartum 08/08/2016  . History of gestational hypertension 08/08/2016  . BMI 40.0-44.9, adult ([place]) 08/08/2016    Past Surgical History:  Procedure Laterality Date  . CESAREAN SECTION      x 2  . CESAREAN SECTION N/A 02/20/2016   Procedure: CESAREAN SECTION;  Surgeon: Will Bonnet, MD;  Location: [facility] ORS;  Service: Obstetrics;  Laterality: N/A;  . CESAREAN SECTION N/A 01/15/2017   Procedure: CESAREAN SECTION;  Surgeon: Gae Dry, MD;  Location: [facility] ORS;  Service: Obstetrics;  Laterality: N/A;  . DILATION AND CURETTAGE OF UTERUS  2009   Missed AB    OB History    Gravida  5   Para  4   Term  4   Preterm      AB  1   Living  3     SAB  1   IAB      Ectopic      Multiple  0   Live Births  3            Home Medications    Prior to Admission medications   Medication Sig Start Date End Date Taking? Authorizing Provider  atorvastatin (LIPITOR) 10 MG tablet Take 1 tablet (10 mg total) by mouth daily. 04/29/20  Yes Malfi, Lupita Raider, FNP  lisinopril (ZESTRIL) 5 MG tablet  Take 1 tablet (5 mg total) by mouth daily. 04/29/20  Yes Malfi, Lupita Raider, FNP  metFORMIN (GLUCOPHAGE) 500 MG tablet Take by mouth 2 (two) times daily with a meal.   Yes [provider]  naproxen (NAPROSYN) 500 MG tablet Take 1 tablet (500 mg total) by mouth 2 (two) times daily for 15 days. 10/07/20 10/22/20 Yes Laurene Footman B, PA-C  tiZANidine (ZANAFLEX) 4 MG tablet Take 1 tablet (4 mg total) by mouth every 8 (eight) hours as needed for up to 10 days for muscle spasms. 10/07/20 10/17/20 Yes Laurene Footman B, PA-C  blood glucose meter kit and supplies Dispense based on patient and insurance preference. Use up to four times daily as directed. (FOR ICD-10 E10.9, E11.9). Patient not taking: Reported on 04/29/2020 12/25/19   Verl Bangs, FNP  empagliflozin  (JARDIANCE) 25 MG TABS tablet Take 1 tablet (25 mg total) by mouth daily before breakfast. 04/29/20   Malfi, Lupita Raider, FNP    Family History Family History  Problem Relation Age of Onset  . Depression Mother   . Prostate cancer Father     Social History Social History   Tobacco Use  . Smoking status: Current Some Day Smoker    Types: Cigarettes    Last attempt to quit: 06/16/2016    Years since quitting: 4.3  . Smokeless tobacco: Never Used  Vaping Use  . Vaping Use: Never used  Substance Use Topics  . Alcohol use: No  . Drug use: No     Allergies   Patient has no known allergies.   Review of Systems Review of Systems  Gastrointestinal: Negative for abdominal pain, nausea and vomiting.  Genitourinary: Negative for difficulty urinating, dysuria and flank pain.  Musculoskeletal: Positive for arthralgias and back pain. Negative for gait problem, joint swelling and myalgias.  Neurological: Negative for weakness and numbness.     Physical Exam Triage Vital Signs ED Triage Vitals  Enc Vitals Group     BP 10/07/20 1146 (!) 153/93     Pulse Rate 10/07/20 1146 82     Resp 10/07/20 1146 18     Temp 10/07/20 1146 98.6 F (37 C)     Temp Source 10/07/20 1146 Oral     SpO2 10/07/20 1146 100 %     Weight 10/07/20 1147 231 lb 11.3 oz (105.1 kg)     Height 10/07/20 1147 5' 1.5" (1.562 m)     Head Circumference --      Peak Flow --      Pain Score 10/07/20 1146 5     Pain Loc --      Pain Edu? --      Excl. in [place]? --    No data found.  Updated Vital Signs BP (!) 153/93 (BP Location: Left Arm)   Pulse 82   Temp 98.6 F (37 C) (Oral)   Resp 18   Ht 5' 1.5" (1.562 m)   Wt 231 lb 11.3 oz (105.1 kg)   LMP 09/16/2020 (Approximate)   SpO2 100%   Breastfeeding No   BMI 43.07 kg/m       Physical Exam Vitals and nursing note reviewed.  Constitutional:      General: She is not in acute distress.    Appearance: Normal appearance. She is not ill-appearing or  toxic-appearing.  HENT:     Head: Normocephalic and atraumatic.  Eyes:     General: No scleral icterus.       Right eye: No  discharge.        Left eye: No discharge.     Conjunctiva/sclera: Conjunctivae normal.  Cardiovascular:     Rate and Rhythm: Normal rate and regular rhythm.     Heart sounds: Normal heart sounds.  Pulmonary:     Effort: Pulmonary effort is normal. No respiratory distress.     Breath sounds: Normal breath sounds.  Musculoskeletal:     Cervical back: Neck supple.     Lumbar back: Tenderness (TTP left paralumbar muscles and left buttocks about the piriformis) present. No bony tenderness. Decreased range of motion. Positive right straight leg raise test. Negative left straight leg raise test.  Skin:    General: Skin is dry.  Neurological:     General: No focal deficit present.     Mental Status: She is alert. Mental status is at baseline.     Motor: No weakness.     Gait: Gait normal.  Psychiatric:        Mood and Affect: Mood normal.        Behavior: Behavior normal.        Thought Content: Thought content normal.      UC Treatments / Results  Labs (all labs ordered are listed, but only abnormal results are displayed) Labs Reviewed - No data to display  EKG   Radiology No results found.  Procedures Procedures (including critical care time)  Medications Ordered in UC Medications  ketorolac (TORADOL) injection 60 mg (60 mg Intramuscular Given 10/07/20 1216)    Initial Impression / Assessment and Plan / UC Course  I have reviewed the triage vital signs and the nursing notes.  Pertinent labs & imaging results that were available during my care of the patient were reviewed by me and considered in my medical decision making (see chart for details).   35 year old female presenting for atraumatic lower back pain with radiating pain down the left leg.  Clinical presentation consistent with lumbar radiculopathy/sciatica.  Patient given 60 mg IM  ketorolac in clinic for pain and inflammation.  Prescribed naproxen and tizanidine.  I did consider corticosteroids, but patient is a noncompliant diabetic so I felt it better to treat with NSAIDs.  Encouraged supportive care with stretches, heat.  Advised to follow-up with Ortho if not better in the next 4 to 6 weeks or for any worsening symptoms.  I did review ED precautions for back pain with patient.  Work note given.  Additionally, I did speak with her about the importance of taking her diabetes medications and potential risks of not taking it including the impact on her kidneys, heart and circulatory system.  Final Clinical Impressions(s) / UC Diagnoses   Final diagnoses:  Acute bilateral low back pain with left-sided sciatica     Discharge Instructions     BACK PAIN: Stressed avoiding painful activities . RICE (REST, ICE, COMPRESSION, ELEVATION) guidelines reviewed. May alternate ice and heat. Consider use of muscle rubs, Salonpas patches, etc. Use medications as directed including muscle relaxers if prescribed. Take anti-inflammatory medications as prescribed or OTC NSAIDs/Tylenol.  F/u with PCP in 7-10 days for reexamination, and please feel free to call or return to the urgent care at any time for any questions or concerns you may have and we will be happy to help you!   BACK PAIN RED FLAGS: If the back pain acutely worsens or there are any red flag symptoms such as numbness/tingling, leg weakness, saddle anesthesia, or loss of bowel/bladder control, go immediately to the ER.  Follow up with [place] as scheduled or sooner if the pain does not begin to resolve or if it worsens before the follow up    You may have a condition requiring you to follow up with Orthopedics so please call one of the following office for appointment:   [facility] [address] [place], [place] [address] Phone: [phone]  [facility] [address], [place], [place] [address] Phone: [phone]      ED Prescriptions    Medication Sig Dispense Auth. Provider   naproxen (NAPROSYN) 500 MG tablet Take 1 tablet (500 mg total) by mouth 2 (two) times daily for 15 days. 30 tablet Laurene Footman B, PA-C   tiZANidine (ZANAFLEX) 4 MG tablet Take 1 tablet (4 mg total) by mouth every 8 (eight) hours as needed for up to 10 days for muscle spasms. 20 tablet Danton Clap, PA-C     I have reviewed the PDMP during this encounter.   Danton Clap, PA-C 10/07/20 1220

## 2020-10-07 NOTE — Discharge Instructions (Signed)
BACK PAIN: Stressed avoiding painful activities . RICE (REST, ICE, COMPRESSION, ELEVATION) guidelines reviewed. May alternate ice and heat. Consider use of muscle rubs, Salonpas patches, etc. Use medications as directed including muscle relaxers if prescribed. Take anti-inflammatory medications as prescribed or OTC NSAIDs/Tylenol.  F/u with PCP in 7-10 days for reexamination, and please feel free to call or return to the urgent care at any time for any questions or concerns you may have and we will be happy to help you!  ° °BACK PAIN RED FLAGS: If the back pain acutely worsens or there are any red flag symptoms such as numbness/tingling, leg weakness, saddle anesthesia, or loss of bowel/bladder control, go immediately to the ER. Follow up with us as scheduled or sooner if the pain does not begin to resolve or if it worsens before the follow up   ° °You may have a condition requiring you to follow up with Orthopedics so please call one of the following office for appointment:  ° °[facility] °[address], , [place] [address] °Phone: [phone] ° °[facility] °[address], [place], [place] [address] °Phone: [phone]  °

## 2021-08-21 LAB — FETAL NONSTRESS TEST

## 2021-08-22 LAB — FETAL NONSTRESS TEST

## 2021-09-01 ENCOUNTER — Encounter: Payer: Self-pay | Admitting: Oncology

## 2021-09-08 ENCOUNTER — Ambulatory Visit: Payer: No Typology Code available for payment source | Admitting: Family Medicine

## 2021-09-22 ENCOUNTER — Ambulatory Visit: Payer: Managed Care, Other (non HMO) | Admitting: Family Medicine

## 2021-09-22 ENCOUNTER — Encounter: Payer: Self-pay | Admitting: Family Medicine

## 2021-09-22 ENCOUNTER — Encounter: Payer: Self-pay | Admitting: Oncology

## 2021-09-22 VITALS — BP 134/88 | HR 106 | Ht 61.5 in | Wt 227.6 lb

## 2021-09-22 DIAGNOSIS — E1169 Type 2 diabetes mellitus with other specified complication: Secondary | ICD-10-CM | POA: Diagnosis not present

## 2021-09-22 DIAGNOSIS — E1165 Type 2 diabetes mellitus with hyperglycemia: Secondary | ICD-10-CM | POA: Diagnosis not present

## 2021-09-22 DIAGNOSIS — I10 Essential (primary) hypertension: Secondary | ICD-10-CM

## 2021-09-22 DIAGNOSIS — Z6841 Body Mass Index (BMI) 40.0 and over, adult: Secondary | ICD-10-CM

## 2021-09-22 DIAGNOSIS — E785 Hyperlipidemia, unspecified: Secondary | ICD-10-CM

## 2021-09-22 MED ORDER — ATORVASTATIN CALCIUM 10 MG PO TABS: 10.0000 mg | ORAL_TABLET | Freq: Every day | ORAL | 0 refills | Status: DC

## 2021-09-22 MED ORDER — OZEMPIC (0.25 OR 0.5 MG/DOSE) 2 MG/1.5ML ~~LOC~~ SOPN: 0.2500 mg | PEN_INJECTOR | SUBCUTANEOUS | 0 refills | Status: DC

## 2021-09-22 MED ORDER — METFORMIN HCL ER 500 MG PO TB24: 500.0000 mg | ORAL_TABLET | Freq: Every day | ORAL | 0 refills | Status: DC

## 2021-09-22 MED ORDER — VALSARTAN 40 MG PO TABS: 40.0000 mg | ORAL_TABLET | Freq: Every day | ORAL | 0 refills | Status: DC

## 2021-09-22 NOTE — Assessment & Plan Note (Signed)
Weight down in past 1 year ?Start GLP1 therapy for DM / Wt ?

## 2021-09-22 NOTE — Patient Instructions (Addendum)
Thank you for coming to the office today. ? ?1. Ozempic (Semaglutide injection) - start 0.25mg  weekly for 4 weeks then increase to 0.5mg  weekly, sample is 6 doses, re-use the same pen until empty, new needle each dose. ? ?After 4-5 weeks let us know on mychart or call to request new order on Ozempic, we can get it approved. You can keep at the 0.5mg  dose for 2-3 months until you return to see [facility]. ? ?Use the online savings copay card [employer]cy (google savings copay card) ? ?Keep on Metformin XR 24 hr pill 500mg  daily in morning with food ? ?Stop Lisinopril, switched to Valsartan 40mg  daily for blood pressure and kidneys. ? ?Restart Atoravstatin 10mg  nightly for cholesterol at bedtime. ?  ?Please schedule a Follow-up Appointment to: Return in about 3 months (around 12/22/2021) for 3 month follow-up w/ PCP for DM, HTN, HLD, need A1c, med review. ? ?If you have any other questions or concerns, please feel free to call the office or send a message through MyChart. You may also schedule an earlier appointment if necessary. ? ?Additionally, you may be receiving a survey about your experience at our office within a few days to 1 week by e-mail or mail. We value your feedback. ? ? , DO ?[facility], Rene Kocher ?

## 2021-09-22 NOTE — Progress Notes (Signed)
? ?Subjective:  ? ? Patient ID: Lisa Richard, female    DOB: 05/08/1986, 36 y.o.   MRN: [id number] ? ?Lisa Richard is a 36 y.o. female presenting on 09/22/2021 for Diabetes, Hypertension, and Hyperlipidemia ? ? ?HPI ? ?PCP Nicki Reaper, FNP , previously w Danielle Rankin, FNP  ? ?CHRONIC DM, Type 2: ?Reports concerns not checking CBG, off meds, request restart meds. Did not start Jardiance before due to concern w side effects. Would miss PM metformin dose ?CBGs: None ?Meds: Previously on Metformin. ?Currently on ACEi but off now needs restart med ?Has upcoming Eye apt at [facility] 11/20/21 ?Denies hypoglycemia, polyuria, visual changes, numbness or tingling. ? ?CHRONIC HTN: ?Reports mild elevated BP. Not checking regularly ?Current Meds - Previously on Lisinopril   ?Denies CP, dyspnea, HA, edema, dizziness / lightheadedness ? ?HYPERLIPIDEMIA: ?- Reports no concerns. Due for lipids. ?On Atorvastatin 10mg  previously need re order ? ? ? ?Family History  ?Problem Relation Age of Onset  ? Depression Mother   ? Prostate cancer Father   ? ? ? ?  09/22/2021  ?  8:58 AM 04/08/2020  ?  8:39 AM 12/18/2019  ? 11:33 AM  ?Depression screen PHQ 2/9  ?Decreased Interest 1 0 3  ?Down, Depressed, Hopeless 1 0 2  ?PHQ - 2 Score 2 0 5  ?Altered sleeping 1 0 3  ?Tired, decreased energy 3 0 3  ?Change in appetite 1 0 3  ?Feeling bad or failure about yourself  1 0 1  ?Trouble concentrating 0 0 1  ?Moving slowly or fidgety/restless 0 0 0  ?Suicidal thoughts 0 0 1  ?PHQ-9 Score 8 0 17  ?Difficult doing work/chores Somewhat difficult Not difficult at all Somewhat difficult  ? ? ?Social History  ? ?Tobacco Use  ? Smoking status: Some Days  ?  Types: Cigarettes  ?  Last attempt to quit: 06/16/2016  ?  Years since quitting: 5.2  ? Smokeless tobacco: Never  ?Vaping Use  ? Vaping Use: Never used  ?Substance Use Topics  ? Alcohol use: No  ? Drug use: No  ? ? ?Review of Systems ?Per HPI unless specifically indicated above ? ?   ?Objective:  ?   ?BP 134/88 (BP Location: Left Arm, Cuff Size: Normal)   Pulse (!) 106   Ht 5' 1.5" (1.562 m)   Wt 227 lb 9.6 oz (103.2 kg)   SpO2 98%   BMI 42.31 kg/m?   ?Wt Readings from Last 3 Encounters:  ?09/22/21 227 lb 9.6 oz (103.2 kg)  ?10/07/20 231 lb 11.3 oz (105.1 kg)  ?04/29/20 231 lb 12.8 oz (105.1 kg)  ?  ?Physical Exam ?Vitals and nursing note reviewed.  ?Constitutional:   ?   General: She is not in acute distress. ?   Appearance: She is well-developed. She is not diaphoretic.  ?   Comments: Well-appearing, comfortable, cooperative  ?HENT:  ?   Head: Normocephalic and atraumatic.  ?Eyes:  ?   General:     ?   Right eye: No discharge.     ?   Left eye: No discharge.  ?   Conjunctiva/sclera: Conjunctivae normal.  ?Neck:  ?   Thyroid: No thyromegaly.  ?Cardiovascular:  ?   Rate and Rhythm: Normal rate and regular rhythm.  ?   Heart sounds: Normal heart sounds. No murmur heard. ?Pulmonary:  ?   Effort: Pulmonary effort is normal. No respiratory distress.  ?   Breath sounds: Normal breath sounds. No  wheezing or rales.  ?Musculoskeletal:     ?   General: Normal range of motion.  ?   Cervical back: Normal range of motion and neck supple.  ?Lymphadenopathy:  ?   Cervical: No cervical adenopathy.  ?Skin: ?   General: Skin is warm and dry.  ?   Findings: No erythema or rash.  ?Neurological:  ?   Mental Status: She is alert and oriented to person, place, and time.  ?Psychiatric:     ?   Behavior: Behavior normal.  ?   Comments: Well groomed, good eye contact, normal speech and thoughts  ? ? ?Diabetic Foot Exam - Simple   ?Simple Foot Form ?Diabetic Foot exam was performed with the following findings: Yes 09/22/2021  9:25 AM  ?Visual Inspection ?See comments: Yes ?Sensation Testing ?See comments: Yes ?Pulse Check ?Posterior Tibialis and Dorsalis pulse intact bilaterally: Yes ?Comments ?Mild reduced monofilament to bilateral heels with some slight callus formation. ?  ? ? ? ? ?Results for orders placed or performed in visit  on 08/22/21  ?Fetal nonstress test  ?Result Value Ref Range  ? NST Locations    ? NST Indications    ? NST Duration    ? NST Baseline    ? FHR Variabilities    ? Accelerations > 10bpm    ? Accelerations > 15 BPM    ? Decelerations    ? Uterine Activity    ? Acoustic Stimulation    ? Assessment    ? NST Return    ? NST Read by    ? ?   ?Assessment & Plan:  ? ?Problem List Items Addressed This Visit   ? ? Type 2 diabetes mellitus with hyperglycemia, without long-term current use of insulin (HCC) - Primary  ? Relevant Medications  ? metFORMIN (GLUCOPHAGE-XR) 500 MG 24 hr tablet  ? OZEMPIC, 0.25 OR 0.5 MG/DOSE, 2 MG/1.5ML SOPN  ? atorvastatin (LIPITOR) 10 MG tablet  ? valsartan (DIOVAN) 40 MG tablet  ? Morbid obesity with BMI of 40.0-44.9, adult (HCC)  ?  Weight down in past 1 year ?Start GLP1 therapy for DM / Wt ? ?  ?  ? Relevant Medications  ? metFORMIN (GLUCOPHAGE-XR) 500 MG 24 hr tablet  ? OZEMPIC, 0.25 OR 0.5 MG/DOSE, 2 MG/1.5ML SOPN  ? Hyperlipidemia associated with type 2 diabetes mellitus (HCC)  ? Relevant Medications  ? metFORMIN (GLUCOPHAGE-XR) 500 MG 24 hr tablet  ? OZEMPIC, 0.25 OR 0.5 MG/DOSE, 2 MG/1.5ML SOPN  ? atorvastatin (LIPITOR) 10 MG tablet  ? valsartan (DIOVAN) 40 MG tablet  ? Essential hypertension  ? Relevant Medications  ? atorvastatin (LIPITOR) 10 MG tablet  ? valsartan (DIOVAN) 40 MG tablet  ?  ?1. Ozempic (Semaglutide injection) - start 0.25mg  weekly for 4 weeks then increase to 0.5mg  weekly, sample is 6 doses, re-use the same pen until empty, new needle each dose. ? ?After 4-5 weeks let us know on mychart or call to request new order on Ozempic, we can get it approved. You can keep at the 0.5mg  dose for 2-3 months [facility] ? ?Use the online savings copay card [email or web address] (google savings copay card) ? ?Keep on Metformin XR 24 hr pill 500mg  daily in morning with food ? ?Stop Lisinopril, switched to Valsartan 40mg  daily for blood pressure and kidneys. ? ?Restart  Atoravstatin 10mg  nightly for cholesterol at bedtime. ? ? ?Meds ordered this encounter  ?Medications  ? metFORMIN (GLUCOPHAGE-XR) 500 MG 24 hr  tablet  ?  Sig: Take 1 tablet (500 mg total) by mouth daily with breakfast.  ?  Dispense:  90 tablet  ?  Refill:  0  ? OZEMPIC, 0.25 OR 0.5 MG/DOSE, 2 MG/1.5ML SOPN  ?  Sig: Inject 0.25 mg into the skin once a week. For first 4 weeks. Then increase dose to 0.5mg  weekly  ?  Dispense:  1.5 mL  ?  Refill:  0  ?  84 day supply / 3 month  ? atorvastatin (LIPITOR) 10 MG tablet  ?  Sig: Take 1 tablet (10 mg total) by mouth at bedtime.  ?  Dispense:  90 tablet  ?  Refill:  0  ? valsartan (DIOVAN) 40 MG tablet  ?  Sig: Take 1 tablet (40 mg total) by mouth daily.  ?  Dispense:  90 tablet  ?  Refill:  0  ? ? ? ? ?Follow up plan: ?Return in about 3 months (around 12/22/2021) for 3 month follow-up w/ Rene Kocher PCP for DM, HTN, HLD, need A1c, med review. ? ?Saralyn Pilar, DO ?[facility] ?09/22/2021, 10:55 PM ?

## 2021-11-20 LAB — HM DIABETES EYE EXAM

## 2021-12-29 ENCOUNTER — Ambulatory Visit: Payer: Managed Care, Other (non HMO) | Admitting: Internal Medicine

## 2021-12-29 NOTE — Progress Notes (Deleted)
Subjective:    Patient ID: Lisa Richard, female    DOB: 10/22/1985, 36 y.o.   MRN: [id number]  HPI  Patient presents to clinic today for her annual exam.  Flu: 02/2020 Tetanus: 10/2016 COVID: Pneumovax: Pap smear: 12/2019 Vision screening: Dentist:  Diet: Exercise:  Review of Systems     Past Medical History:  Diagnosis Date   Anemia    Diabetes mellitus without complication ([place])    gestational   Hypertension    Obesity     Current Outpatient Medications  Medication Sig Dispense Refill   atorvastatin (LIPITOR) 10 MG tablet Take 1 tablet (10 mg total) by mouth at bedtime. 90 tablet 0   blood glucose meter kit and supplies Dispense based on patient and insurance preference. Use up to four times daily as directed. (FOR ICD-10 E10.9, E11.9). (Patient not taking: Reported on 04/29/2020) 1 each 0   metFORMIN (GLUCOPHAGE-XR) 500 MG 24 hr tablet Take 1 tablet (500 mg total) by mouth daily with breakfast. 90 tablet 0   OZEMPIC, 0.25 OR 0.5 MG/DOSE, 2 MG/1.5ML SOPN Inject 0.25 mg into the skin once a week. For first 4 weeks. Then increase dose to 0.58m weekly 1.5 mL 0   valsartan (DIOVAN) 40 MG tablet Take 1 tablet (40 mg total) by mouth daily. 90 tablet 0   No current facility-administered medications for this visit.    No Known Allergies  Family History  Problem Relation Age of Onset   Depression Mother    Prostate cancer Father     Social History   Socioeconomic History   Marital status: Single    Spouse name: Not on file   Number of children: Not on file   Years of education: Not on file   Highest education level: Not on file  Occupational History   Not on file  Tobacco Use   Smoking status: Some Days    Types: Cigarettes    Last attempt to quit: 06/16/2016    Years since quitting: 5.5   Smokeless tobacco: Never  Vaping Use   Vaping Use: Never used  Substance and Sexual Activity   Alcohol use: No   Drug use: No   Sexual activity: Not Currently     Birth control/protection: None  Other Topics Concern   Not on file  Social History Narrative   Not on file   Social Determinants of Health   Financial Resource Strain: Not on file  Food Insecurity: Not on file  Transportation Needs: Not on file  Physical Activity: Not on file  Stress: Not on file  Social Connections: Not on file  Intimate Partner Violence: Not on file     Constitutional: Denies fever, malaise, fatigue, headache or abrupt weight changes.  HEENT: Denies eye pain, eye redness, ear pain, ringing in the ears, wax buildup, runny nose, nasal congestion, bloody nose, or sore throat. Respiratory: Denies difficulty breathing, shortness of breath, cough or sputum production.   Cardiovascular: Denies chest pain, chest tightness, palpitations or swelling in the hands or feet.  Gastrointestinal: Denies abdominal pain, bloating, constipation, diarrhea or blood in the stool.  GU: Denies urgency, frequency, pain with urination, burning sensation, blood in urine, odor or discharge. Musculoskeletal: Denies decrease in range of motion, difficulty with gait, muscle pain or joint pain and swelling.  Skin: Denies redness, rashes, lesions or ulcercations.  Neurological: Patient reports insomnia.  Denies dizziness, difficulty with memory, difficulty with speech or problems with balance and coordination.  Psych: Patient has a  history of anxiety.  Denies depression, SI/HI.  No other specific complaints in a complete review of systems (except as listed in HPI above).  Objective:   Physical Exam   There were no vitals taken for this visit. Wt Readings from Last 3 Encounters:  09/22/21 227 lb 9.6 oz (103.2 kg)  10/07/20 231 lb 11.3 oz (105.1 kg)  04/29/20 231 lb 12.8 oz (105.1 kg)    General: Appears their stated age, well developed, well nourished in NAD. Skin: Warm, dry and intact. No rashes, lesions or ulcerations noted. HEENT: Head: normal shape and size; Eyes: sclera white, no  icterus, conjunctiva pink, PERRLA and EOMs intact; Ears: Tm's gray and intact, normal light reflex; Nose: mucosa pink and moist, septum midline; Throat/Mouth: Teeth present, mucosa pink and moist, no exudate, lesions or ulcerations noted.  Neck:  Neck supple, trachea midline. No masses, lumps or thyromegaly present.  Cardiovascular: Normal rate and rhythm. S1,S2 noted.  No murmur, rubs or gallops noted. No JVD or BLE edema. No carotid bruits noted. Pulmonary/Chest: Normal effort and positive vesicular breath sounds. No respiratory distress. No wheezes, rales or ronchi noted.  Abdomen: Soft and nontender. Normal bowel sounds. No distention or masses noted. Liver, spleen and kidneys non palpable. Musculoskeletal: Normal range of motion. No signs of joint swelling. No difficulty with gait.  Neurological: Alert and oriented. Cranial nerves II-XII grossly intact. Coordination normal.  Psychiatric: Mood and affect normal. Behavior is normal. Judgment and thought content normal.    BMET    Component Value Date/Time   NA 136 12/18/2019 1135   NA 137 10/11/2016 0936   NA 144 08/31/2011 2008   K 4.4 12/18/2019 1135   K 3.6 08/31/2011 2008   CL 99 12/18/2019 1135   CL 108 (H) 08/31/2011 2008   CO2 26 12/18/2019 1135   CO2 25 08/31/2011 2008   GLUCOSE 237 (H) 12/18/2019 1135   GLUCOSE 76 08/31/2011 2008   BUN 9 12/18/2019 1135   BUN 5 (L) 10/11/2016 0936   BUN 8 08/31/2011 2008   CREATININE 0.86 12/18/2019 1135   CALCIUM 9.9 12/18/2019 1135   CALCIUM 8.8 08/31/2011 2008   GFRNONAA 90 12/18/2019 1135   GFRAA 104 12/18/2019 1135    Lipid Panel     Component Value Date/Time   CHOL 162 12/18/2019 1135   TRIG 187 (H) 12/18/2019 1135   HDL 33 (L) 12/18/2019 1135   CHOLHDL 4.9 12/18/2019 1135   LDLCALC 101 (H) 12/18/2019 1135    CBC    Component Value Date/Time   WBC 7.6 12/18/2019 1135   RBC 4.68 12/18/2019 1135   HGB 9.7 (L) 12/18/2019 1135   HGB 7.9 (L) 10/31/2016 0900   HCT 33.9  (L) 12/18/2019 1135   HCT 27.1 (L) 10/31/2016 0900   PLT 382 12/18/2019 1135   PLT 219 10/31/2016 0900   MCV 72.4 (L) 12/18/2019 1135   MCV 72 (L) 10/31/2016 0900   MCV 65 (L) 12/04/2011 0942   MCH 20.7 (L) 12/18/2019 1135   MCHC 28.6 (L) 12/18/2019 1135   RDW 15.0 12/18/2019 1135   RDW 16.1 (H) 10/31/2016 0900   RDW 19.1 (H) 12/04/2011 0942   LYMPHSABS 2,037 12/18/2019 1135   LYMPHSABS 1.5 10/31/2016 0900   LYMPHSABS 1.6 12/04/2011 0942   MONOABS 0.7 01/15/2017 1535   MONOABS 0.4 12/04/2011 0942   EOSABS 99 12/18/2019 1135   EOSABS 0.1 10/31/2016 0900   EOSABS 0.0 12/04/2011 0942   BASOSABS 68 12/18/2019 1135  BASOSABS 0.0 10/31/2016 0900   BASOSABS 0.1 12/04/2011 0942    Hgb A1C Lab Results  Component Value Date   HGBA1C 8.6 (A) 04/29/2020          Assessment & Plan:   Preventative Health Maintenance:  Encouraged her to get a flu shot in the fall Tetanus UTD Encouraged her to get her COVID-vaccine Pneumovax Pap smear UTD Encouraged her to consume a balanced diet and exercise regimen Advised her to see an eye doctor and dentist annually We will check CBC, c-Met, lipid, A1c, urine microalbumin today  RTC in 3 months, follow-up chronic conditions Webb Silversmith, NP

## 2022-04-23 ENCOUNTER — Ambulatory Visit: Payer: Self-pay | Admitting: Internal Medicine

## 2022-04-23 NOTE — Progress Notes (Signed)
Pt came for appointment and was not prepared to make a copay.

## 2022-05-07 ENCOUNTER — Encounter: Payer: Self-pay | Admitting: Oncology

## 2022-05-12 ENCOUNTER — Encounter: Payer: Self-pay | Admitting: Oncology

## 2022-07-30 ENCOUNTER — Ambulatory Visit: Payer: 59 | Admitting: Internal Medicine

## 2022-07-30 ENCOUNTER — Encounter: Payer: Self-pay | Admitting: Oncology

## 2022-07-30 NOTE — Progress Notes (Signed)
Pt canceled her appointment.

## 2023-03-12 ENCOUNTER — Encounter: Payer: Self-pay | Admitting: Oncology

## 2023-03-12 ENCOUNTER — Ambulatory Visit
Admission: EM | Admit: 2023-03-12 | Discharge: 2023-03-12 | Disposition: A | Discharge status: Home / Self Care | Payer: Managed Care, Other (non HMO) | Attending: Internal Medicine | Admitting: Internal Medicine

## 2023-03-12 ENCOUNTER — Encounter: Payer: Self-pay | Admitting: Emergency Medicine

## 2023-03-12 DIAGNOSIS — M7918 Myalgia, other site: Secondary | ICD-10-CM

## 2023-03-12 MED ORDER — NAPROXEN 500 MG PO TABS: 500.0000 mg | ORAL_TABLET | Freq: Two times a day (BID) | ORAL | 0 refills | Status: AC

## 2023-03-12 MED ORDER — CYCLOBENZAPRINE HCL 10 MG PO TABS: 10.0000 mg | ORAL_TABLET | Freq: Two times a day (BID) | ORAL | 0 refills | Status: DC | PRN

## 2023-03-12 NOTE — ED Provider Notes (Signed)
[facility]    CSN: [id number] Arrival date & time: 03/12/23  1910      History   Chief Complaint Chief Complaint  Patient presents with   Arm Pain    HPI Lisa Richard is a 37 y.o. female resents for left shoulder pain.  Patient states she has had about 2 weeks of lateral/posterior left shoulder pain.  Denies any known injury or inciting event.  States pain primarily occurs with lifting or rotation of the shoulder.  Denies any swelling, ecchymosis, numbness/tingling/weakness of the upper extremity, erythema, warmth, fevers or chills, neck pain.  She does states she had a pimple on her anterior left shoulder that she popped around the same time symptoms began.  Again denies any redness or swelling at that site as well.  She also does not have pain at the site of the pimple that she popped.  Denies any history of left shoulder injuries or surgeries in the past.  She has not taken any OTC medications for symptoms.  No other concerns at this time.   Arm Pain    Past Medical History:  Diagnosis Date   Anemia    Diabetes mellitus without complication (HCC)    gestational   Hypertension    Obesity     Patient Active Problem List   Diagnosis Date Noted   Hyperlipidemia associated with type 2 diabetes mellitus (HCC) 09/22/2021   Type 2 diabetes mellitus with hyperglycemia, without long-term current use of insulin (HCC) 12/25/2019   Anxiety 12/18/2019   Insomnia 12/18/2019   Essential hypertension 02/07/2017   Anemia affecting pregnancy in third trimester 10/12/2016   Morbid obesity with BMI of 40.0-44.9, adult (HCC) 08/08/2016    Past Surgical History:  Procedure Laterality Date   CESAREAN SECTION      x 2   CESAREAN SECTION N/A 02/20/2016   Procedure: CESAREAN SECTION;  Surgeon: Conard Novak, MD;  Location: [facility] ORS;  Service: Obstetrics;  Laterality: N/A;   CESAREAN SECTION N/A 01/15/2017   Procedure: CESAREAN SECTION;  Surgeon: Nadara Mustard, MD;   Location: [facility] ORS;  Service: Obstetrics;  Laterality: N/A;   DILATION AND CURETTAGE OF UTERUS  2009   Missed AB    OB History     Gravida  5   Para  4   Term  4   Preterm      AB  1   Living  3      SAB  1   IAB      Ectopic      Multiple  0   Live Births  3            Home Medications    Prior to Admission medications   Medication Sig Start Date End Date Taking? Authorizing Provider  cyclobenzaprine (FLEXERIL) 10 MG tablet Take 1 tablet (10 mg total) by mouth 2 (two) times daily as needed for muscle spasms. 03/12/23  Yes Radford Pax, NP  naproxen (NAPROSYN) 500 MG tablet Take 1 tablet (500 mg total) by mouth 2 (two) times daily for 5 days. 03/12/23 03/17/23 Yes Radford Pax, NP  atorvastatin (LIPITOR) 10 MG tablet Take 1 tablet (10 mg total) by mouth at bedtime. 09/22/21   Karamalegos, Netta Neat, DO  blood glucose meter kit and supplies Dispense based on patient and insurance preference. Use up to four times daily as directed. (FOR ICD-10 E10.9, E11.9). Patient not taking: Reported on 04/29/2020 12/25/19   Tarri Fuller, FNP  metFORMIN (GLUCOPHAGE-XR) 500 MG 24 hr tablet Take 1 tablet (500 mg total) by mouth daily with breakfast. 09/22/21   Karamalegos, Netta Neat, DO  OZEMPIC, 0.25 OR 0.5 MG/DOSE, 2 MG/1.5ML SOPN Inject 0.25 mg into the skin once a week. For first 4 weeks. Then increase dose to 0.5mg  weekly 09/22/21   Smitty Cords, DO  valsartan (DIOVAN) 40 MG tablet Take 1 tablet (40 mg total) by mouth daily. 09/22/21   Smitty Cords, DO    Family History Family History  Problem Relation Age of Onset   Depression Mother    Prostate cancer Father     Social History Social History   Tobacco Use   Smoking status: Some Days    Current packs/day: 0.00    Types: Cigarettes    Last attempt to quit: 06/16/2016    Years since quitting: 6.7   Smokeless tobacco: Never  Vaping Use   Vaping status: Never Used  Substance Use Topics    Alcohol use: No   Drug use: No     Allergies   Patient has no known allergies.   Review of Systems Review of Systems  Musculoskeletal:        Left shoulder pain     Physical Exam Triage Vital Signs ED Triage Vitals  Encounter Vitals Group     BP 03/12/23 1918 (!) 151/95     Systolic BP Percentile --      Diastolic BP Percentile --      Pulse Rate 03/12/23 1918 89     Resp 03/12/23 1918 18     Temp 03/12/23 1918 99.3 F (37.4 C)     Temp Source 03/12/23 1918 Oral     SpO2 03/12/23 1918 98 %     Weight --      Height --      Head Circumference --      Peak Flow --      Pain Score 03/12/23 1916 10     Pain Loc --      Pain Education --      Exclude from Growth Chart --    No data found.  Updated Vital Signs BP (!) 151/95 (BP Location: Right Arm)   Pulse 89   Temp 99.3 F (37.4 C) (Oral)   Resp 18   LMP 02/21/2023   SpO2 98%   Visual Acuity Right Eye Distance:   Left Eye Distance:   Bilateral Distance:    Right Eye Near:   Left Eye Near:    Bilateral Near:     Physical Exam Vitals and nursing note reviewed.  Constitutional:      General: She is not in acute distress.    Appearance: She is obese. She is not ill-appearing.  HENT:     Head: Normocephalic and atraumatic.  Eyes:     Pupils: Pupils are equal, round, and reactive to light.  Cardiovascular:     Rate and Rhythm: Normal rate.  Pulmonary:     Effort: Pulmonary effort is normal.  Musculoskeletal:     Left shoulder: Tenderness present. No swelling, deformity, effusion, laceration, bony tenderness or crepitus. Normal range of motion. Normal strength. Normal pulse.       Arms:     Comments: Tender to palpation to left trapezius muscle that extends slightly into left lateral shoulder.  There is no anterior tenderness with palpation.  There is no swelling, erythema, warmth, induration or fluctuance of the anterior shoulder where patient reports pimple had been.  Full range of motion of  shoulder with pain on internal rotation only.  Strength is 5 out of 5 bilateral upper extremities.  Skin:    General: Skin is warm and dry.  Neurological:     General: No focal deficit present.     Mental Status: She is alert and oriented to person, place, and time.  Psychiatric:        Mood and Affect: Mood normal.        Behavior: Behavior normal.      UC Treatments / Results  Labs (all labs ordered are listed, but only abnormal results are displayed) Labs Reviewed - No data to display  EKG   Radiology No results found.  Procedures Procedures (including critical care time)  Medications Ordered in UC Medications - No data to display  Initial Impression / Assessment and Plan / UC Course  I have reviewed the triage vital signs and the nursing notes.  Pertinent labs & imaging results that were available during my care of the patient were reviewed by me and considered in my medical decision making (see chart for details).  Clinical Course as of 03/12/23 1933  Tue Mar 12, 2023  1929 Temp recheck 98.5 oral  [JM]    Clinical Course User Index [JM] Radford Pax, NP    Reviewed exam and symptoms with patient.  No red flags.  She is well-appearing and afebrile in clinic.  Discussed do not feel shoulder pain is related to the pimples that she popped as there is no sign of abscess or infection.  Discussed musculoskeletal cause of pain she is tender to her trapezius muscle that extends to the lateral shoulder.  Again no anterior shoulder tenderness with palpation.  Will do trial of Flexeril, side effect profile reviewed.  Also start naproxen twice daily for 5 days.  Heat to the site.  Vies PCP follow-up in 2 to 3 days for recheck.  ER precautions reviewed and patient verbalized understanding Final Clinical Impressions(s) / UC Diagnoses   Final diagnoses:  Pain in muscle of shoulder     Discharge Instructions      Start Flexeril twice daily as needed.  Please note this  medication can make you drowsy.  Do not drink alcohol or drive on this medication.  Also start naproxen twice daily for 5 days.  Take this with food.  Heat to the shoulder as needed.  Please follow-up with your PCP in 2 to 3 days if your symptoms do not improve.  Please go to the ER if you develop any worsening symptoms.  I hope you feel better soon!    ED Prescriptions     Medication Sig Dispense Auth. Provider   cyclobenzaprine (FLEXERIL) 10 MG tablet Take 1 tablet (10 mg total) by mouth 2 (two) times daily as needed for muscle spasms. 10 tablet Radford Pax, NP   naproxen (NAPROSYN) 500 MG tablet Take 1 tablet (500 mg total) by mouth 2 (two) times daily for 5 days. 10 tablet Radford Pax, NP      PDMP not reviewed this encounter.   Radford Pax, NP 03/12/23 [phone]

## 2023-03-12 NOTE — ED Triage Notes (Addendum)
Pt had a bump on her left shoulder that she popped 2 weeks ago and puss came out. The next day she developed left arm pain. Pt has not tried anything for pain relief.

## 2023-03-12 NOTE — Discharge Instructions (Signed)
Start Flexeril twice daily as needed.  Please note this medication can make you drowsy.  Do not drink alcohol or drive on this medication.  Also start naproxen twice daily for 5 days.  Take this with food.  Heat to the shoulder as needed.  Please follow-up with your PCP in 2 to 3 days if your symptoms do not improve.  Please go to the ER if you develop any worsening symptoms.  I hope you feel better soon!

## 2023-05-09 DIAGNOSIS — E785 Hyperlipidemia, unspecified: Secondary | ICD-10-CM | POA: Insufficient documentation

## 2023-12-02 LAB — HM DIABETES EYE EXAM

## 2024-03-12 ENCOUNTER — Encounter: Payer: Self-pay | Admitting: Oncology

## 2024-03-19 ENCOUNTER — Encounter: Payer: Self-pay | Admitting: Oncology

## 2024-03-19 ENCOUNTER — Ambulatory Visit (INDEPENDENT_AMBULATORY_CARE_PROVIDER_SITE_OTHER): Payer: Self-pay | Admitting: Pediatrics

## 2024-03-19 VITALS — BP 137/80 | HR 91 | Temp 98.0°F | Ht 61.0 in | Wt 212.6 lb

## 2024-03-19 DIAGNOSIS — E1169 Type 2 diabetes mellitus with other specified complication: Secondary | ICD-10-CM

## 2024-03-19 DIAGNOSIS — Z7689 Persons encountering health services in other specified circumstances: Secondary | ICD-10-CM

## 2024-03-19 DIAGNOSIS — E1165 Type 2 diabetes mellitus with hyperglycemia: Secondary | ICD-10-CM

## 2024-03-19 DIAGNOSIS — M79641 Pain in right hand: Secondary | ICD-10-CM

## 2024-03-19 DIAGNOSIS — Z6841 Body Mass Index (BMI) 40.0 and over, adult: Secondary | ICD-10-CM

## 2024-03-19 MED ORDER — MELOXICAM 15 MG PO TABS: 15.0000 mg | ORAL_TABLET | Freq: Every day | ORAL | 0 refills | Status: AC

## 2024-03-19 NOTE — Patient Instructions (Signed)

## 2024-03-19 NOTE — Progress Notes (Signed)
 Establish Care Note  BP 137/80   Pulse 91   Temp 98 F (36.7 C) (Oral)   Ht 5' 1 (1.549 m)   Wt 212 lb 9.6 oz (96.4 kg)   LMP 03/10/2024 (Approximate)   SpO2 98%   BMI 40.17 kg/m    Subjective:    Patient ID: Lisa Richard, female    DOB: 19-Jul-1985, 39 y.o.   MRN: [id number]  HPI: Lisa Richard is a 38 y.o. female  Chief Complaint  Patient presents with   Establish Care    Right hand soreness arthritis     Establishing care, the following was discussed today:  Discussed the use of AI scribe software for clinical note transcription with the patient, who gave verbal consent to proceed.  History of Present Illness   Lisa Richard is a 38 year old female with diabetes who presents with hand pain.  She has been experiencing throbbing pain in both hands for the past three to four months, primarily at rest and more noticeable at night. The pain is now mostly in one finger, which she describes as having a burning sensation and feeling larger than the corresponding finger on the other hand. No warmth is noted in the area, and the pain is absent during physical activity.  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 She occasionally experiences shooting pain, which she associates with carpal tunnel syndrome, and sometimes uses a brace, particularly at night.  There is a family history of rheumatoid arthritis, as her mother has the condition. She is unsure if her mother has rheumatoid or osteoarthritis, but she believes it is rheumatoid arthritis.  She manages her diabetes with Ozempic  at a dose of 1 mg and reports occasional stomach upset as a side effect. No other significant side effects from the medication are noted.  No pain in other areas of the hand or arm, except for occasional shooting pain, which she believes is due to carpal tunnel syndrome. Reports pain is worse at night and occurs at  rest.        Current Outpatient Medications on File Prior to Visit  Medication Sig Dispense Refill   OZEMPIC , 0.25 OR 0.5 MG/DOSE, 2 MG/1.5ML SOPN Inject 0.25 mg into the skin once a week. For first 4 weeks. Then increase dose to 0.5mg  weekly 1.5 mL 0   atorvastatin  (LIPITOR) 10 MG tablet Take 1 tablet (10 mg total) by mouth at bedtime. (Patient not taking: Reported on 03/19/2024) 90 tablet 0   blood glucose meter kit and supplies Dispense based on patient and insurance preference. Use up to four times daily as directed. (FOR ICD-10 E10.9, E11.9). (Patient not taking: Reported on 03/19/2024) 1 each 0   cyclobenzaprine  (FLEXERIL ) 10 MG tablet Take 1 tablet (10 mg total) by mouth 2 (two) times daily as needed for muscle spasms. (Patient not taking: Reported on 03/19/2024) 10 tablet 0   valsartan  (DIOVAN ) 40 MG tablet Take 1 tablet (40 mg total) by mouth daily. (Patient not taking: Reported on 03/19/2024) 90 tablet 0   No current facility-administered medications on file prior to visit.    #HM Will review HM records and updated as needed.  Relevant past medical, surgical, family and social history reviewed and updated as indicated. Interim medical history since our last visit reviewed. Allergies and medications reviewed and updated.  ROS per HPI unless specifically indicated above     Objective:  BP 137/80   Pulse 91   Temp 98 F (36.7 C) (Oral)   Ht 5' 1 (1.549 m)   Wt 212 lb 9.6 oz (96.4 kg)   LMP 03/10/2024 (Approximate)   SpO2 98%   BMI 40.17 kg/m   Wt Readings from Last 3 Encounters:  03/19/24 212 lb 9.6 oz (96.4 kg)  09/22/21 227 lb 9.6 oz (103.2 kg)  10/07/20 231 lb 11.3 oz (105.1 kg)     Physical Exam Constitutional:      Appearance: Normal appearance.  Pulmonary:     Effort: Pulmonary effort is normal.  Musculoskeletal:        General: Normal range of motion.     Right wrist: Tenderness present.  Skin:    Comments: Normal skin color  Neurological:      General: No focal deficit present.     Mental Status: She is alert. Mental status is at baseline.  Psychiatric:        Mood and Affect: Mood normal.        Behavior: Behavior normal.        Thought Content: Thought content normal.         03/19/2024    3:44 PM 09/22/2021    8:58 AM 04/08/2020    8:39 AM 12/18/2019   11:33 AM 11/13/2016    9:32 AM  Depression screen PHQ 2/9  Decreased Interest 0 1 0 3 0  Down, Depressed, Hopeless 0 1 0 2 0  PHQ - 2 Score 0 2 0 5 0  Altered sleeping 1 1 0 3   Tired, decreased energy 2 3 0 3   Change in appetite 0 1 0 3   Feeling bad or failure about yourself  0 1 0 1   Trouble concentrating 0 0 0 1   Moving slowly or fidgety/restless 0 0 0 0   Suicidal thoughts 0 0 0 1   PHQ-9 Score 3 8 0 17   Difficult doing work/chores Not difficult at all Somewhat difficult Not difficult at all Somewhat difficult         03/19/2024    3:44 PM 09/22/2021    8:58 AM 04/08/2020    8:40 AM 12/18/2019   11:33 AM  GAD 7 : Generalized Anxiety Score  Nervous, Anxious, on Edge 0 0 0 1  Control/stop worrying 0 0 0 3  Worry too much - different things 1 1 0 3  Trouble relaxing 1 0 0 3  Restless 0 0 0 3  Easily annoyed or irritable 0 1 0 3  Afraid - awful might happen 0 0 0 0  Total GAD 7 Score 2 2 0 16  Anxiety Difficulty Somewhat difficult Not difficult at all Not difficult at all Not difficult at all       Assessment & Plan:  Assessment & Plan   Right hand pain Chronic pain for 3-4 months, possibly arthritis or carpal tunnel syndrome. Family history of rheumatoid arthritis noted. - Prescribe meloxicam for 14 days. - Order blood tests for rheumatoid arthritis pending insurance. - Plan imaging studies pending insurance. - Advise night wrist brace for carpal tunnel syndrome. - Discuss joint space injections if symptoms persist. -     Meloxicam; Take 1 tablet (15 mg total) by mouth daily for 14 days.  Dispense: 14 tablet; Refill: 0  Morbid obesity with  BMI of 40.0-44.9, adult (HCC) Type 2 diabetes mellitus with hyperglycemia, without long-term current use of insulin (HCC) Managed with Ozempic , mild  gastrointestinal side effects. Discussed potential switch to Mounjaro for better tolerance. - Continue Ozempic . - Will wait to get labs and discuss mounjaro until after we know about updated insurance coverage.   Encounter to establish care Reviewed available patient record including history, medications, problem list. HM updated as able. Will review and/or request outside records (if applicable) and will fill remaining HM gaps as needed at follow up visit.  Follow up plan: Pending insurance coverage  Lisa SHAUNNA Nett, MD

## 2024-03-20 ENCOUNTER — Ambulatory Visit: Admitting: Pediatrics

## 2024-03-24 ENCOUNTER — Encounter: Payer: Self-pay | Admitting: Oncology

## 2024-03-25 ENCOUNTER — Encounter: Payer: Self-pay | Admitting: Pediatrics

## 2024-04-22 ENCOUNTER — Other Ambulatory Visit: Payer: Self-pay | Admitting: Pediatrics

## 2024-04-22 ENCOUNTER — Encounter: Payer: Self-pay | Admitting: Oncology

## 2024-04-22 DIAGNOSIS — E1165 Type 2 diabetes mellitus with hyperglycemia: Secondary | ICD-10-CM

## 2024-04-22 NOTE — Progress Notes (Signed)
 Placing future lab orders previously deferred for DM  Lisa SHAUNNA Nett, MD

## 2024-04-22 NOTE — Telephone Encounter (Signed)
 Appointments scheduled

## 2024-04-22 NOTE — Telephone Encounter (Signed)
 Called patient, lvm for patient to call office and schedule f/u with Dr. Herold week of 12/16 and a lab only visit in the next 1-2 weeks before the appointment.

## 2024-05-06 ENCOUNTER — Other Ambulatory Visit

## 2024-05-06 DIAGNOSIS — E1165 Type 2 diabetes mellitus with hyperglycemia: Secondary | ICD-10-CM

## 2024-05-06 LAB — MICROALBUMIN, URINE WAIVED
Creatinine, Urine Waived: 300 mg/dL (ref 10–300)
Microalb, Ur Waived: 150 mg/L — ABNORMAL HIGH (ref 0–19)

## 2024-05-07 LAB — COMPREHENSIVE METABOLIC PANEL WITH GFR
ALT: 10 IU/L (ref 0–32)
AST: 16 IU/L (ref 0–40)
Albumin: 4.3 g/dL (ref 3.9–4.9)
Alkaline Phosphatase: 65 IU/L (ref 41–116)
BUN/Creatinine Ratio: 12 (ref 9–23)
BUN: 10 mg/dL (ref 6–20)
Bilirubin Total: 0.2 mg/dL (ref 0.0–1.2)
CO2: 23 mmol/L (ref 20–29)
Calcium: 9.1 mg/dL (ref 8.7–10.2)
Chloride: 106 mmol/L (ref 96–106)
Creatinine, Ser: 0.81 mg/dL (ref 0.57–1.00)
Globulin, Total: 3 g/dL (ref 1.5–4.5)
Glucose: 106 mg/dL — ABNORMAL HIGH (ref 70–99)
Potassium: 3.7 mmol/L (ref 3.5–5.2)
Sodium: 141 mmol/L (ref 134–144)
Total Protein: 7.3 g/dL (ref 6.0–8.5)
eGFR: 96 mL/min/1.73 (ref >59)

## 2024-05-07 LAB — LIPID PANEL
Chol/HDL Ratio: 4.8 ratio — ABNORMAL HIGH (ref 0.0–4.4)
Cholesterol, Total: 152 mg/dL (ref 100–199)
HDL: 32 mg/dL — ABNORMAL LOW (ref >39)
LDL Chol Calc (NIH): 75 mg/dL (ref 0–99)
Triglycerides: 278 mg/dL — ABNORMAL HIGH (ref 0–149)
VLDL Cholesterol Cal: 45 mg/dL — ABNORMAL HIGH (ref 5–40)

## 2024-05-07 LAB — HEMOGLOBIN A1C
Est. average glucose Bld gHb Est-mCnc: 126 mg/dL
Hgb A1c MFr Bld: 6 % — ABNORMAL HIGH (ref 4.8–5.6)

## 2024-05-08 ENCOUNTER — Ambulatory Visit: Payer: Self-pay | Admitting: Pediatrics

## 2024-05-08 ENCOUNTER — Ambulatory Visit
Admission: RE | Admit: 2024-05-08 | Discharge: 2024-05-08 | Disposition: A | Source: Home / Self Care | Attending: Pediatrics | Admitting: Pediatrics

## 2024-05-08 ENCOUNTER — Ambulatory Visit
Admission: RE | Admit: 2024-05-08 | Discharge: 2024-05-08 | Disposition: A | Source: Ambulatory Visit | Attending: Pediatrics | Admitting: Pediatrics

## 2024-05-08 ENCOUNTER — Other Ambulatory Visit: Payer: Self-pay | Admitting: Pediatrics

## 2024-05-08 DIAGNOSIS — M79641 Pain in right hand: Secondary | ICD-10-CM

## 2024-05-08 NOTE — Progress Notes (Signed)
 Xray discussed at last visit, placed today.  Lisa SHAUNNA Nett, MD

## 2024-05-13 ENCOUNTER — Ambulatory Visit: Admitting: Pediatrics

## 2024-05-13 ENCOUNTER — Encounter: Payer: Self-pay | Admitting: Pediatrics

## 2024-05-13 VITALS — BP 114/71 | HR 79 | Temp 98.3°F | Ht 61.0 in | Wt 214.8 lb

## 2024-05-13 DIAGNOSIS — M79641 Pain in right hand: Secondary | ICD-10-CM | POA: Diagnosis not present

## 2024-05-13 DIAGNOSIS — Z6841 Body Mass Index (BMI) 40.0 and over, adult: Secondary | ICD-10-CM

## 2024-05-13 DIAGNOSIS — Z7985 Long-term (current) use of injectable non-insulin antidiabetic drugs: Secondary | ICD-10-CM

## 2024-05-13 DIAGNOSIS — E1165 Type 2 diabetes mellitus with hyperglycemia: Secondary | ICD-10-CM

## 2024-05-13 MED ORDER — TIRZEPATIDE 2.5 MG/0.5ML ~~LOC~~ SOAJ: 2.5000 mg | SUBCUTANEOUS | 0 refills | Status: AC

## 2024-05-13 NOTE — Progress Notes (Signed)
 Office Visit  BP 114/71   Pulse 79   Temp 98.3 F (36.8 C) (Oral)   Ht 5' 1 (1.549 m)   Wt 214 lb 12.8 oz (97.4 kg)   LMP 05/07/2024 (Exact Date)   SpO2 97%   BMI 40.59 kg/m    Subjective:    Patient ID: Lisa Richard, female    DOB: 11-20-1985, 38 y.o.   MRN: [id number]  HPI: Lisa Richard is a 38 y.o. female  Chief Complaint  Patient presents with   Diabetes    Eye exam requested from [facility]    Discussed the use of AI scribe software for clinical note transcription with the patient, who gave verbal consent to proceed.  History of Present Illness   Lisa Richard is a 38 year old female with diabetes who presents with hand pain.  She experiences persistent pain in two fingers of her hand. The pain has remained constant and does not radiate. She has undergone an x-ray, but the final report is pending.  She has diabetes, which is managed with Ozempic . Her recent A1c is 6. However, she experiences gastrointestinal side effects from the medication, including nausea, diarrhea, and constipation. Recent lab work shows normal kidney function and low LDL cholesterol, but her HDL cholesterol is slightly low. There is a small amount of protein in her urine.      Relevant past medical, surgical, family and social history reviewed and updated as indicated. Interim medical history since our last visit reviewed. Allergies and medications reviewed and updated.  ROS per HPI unless specifically indicated above     Objective:    BP 114/71   Pulse 79   Temp 98.3 F (36.8 C) (Oral)   Ht 5' 1 (1.549 m)   Wt 214 lb 12.8 oz (97.4 kg)   LMP 05/07/2024 (Exact Date)   SpO2 97%   BMI 40.59 kg/m   Wt Readings from Last 3 Encounters:  05/13/24 214 lb 12.8 oz (97.4 kg)  03/19/24 212 lb 9.6 oz (96.4 kg)  09/22/21 227 lb 9.6 oz (103.2 kg)     Physical Exam Constitutional:      Appearance: Normal appearance.  Pulmonary:     Effort: Pulmonary effort is  normal.  Musculoskeletal:        General: Normal range of motion.     Right hand: Tenderness present.     Comments: Middle and ring finger pain and MCP joint  Skin:    Comments: Normal skin color  Neurological:     General: No focal deficit present.     Mental Status: She is alert. Mental status is at baseline.  Psychiatric:        Mood and Affect: Mood normal.        Behavior: Behavior normal.        Thought Content: Thought content normal.         05/13/2024    8:19 AM 03/19/2024    3:44 PM 09/22/2021    8:58 AM 04/08/2020    8:39 AM 12/18/2019   11:33 AM  Depression screen PHQ 2/9  Decreased Interest 0 0 1 0 3  Down, Depressed, Hopeless 0 0 1 0 2  PHQ - 2 Score 0 0 2 0 5  Altered sleeping 2 1 1  0 3  Tired, decreased energy 2 2 3  0 3  Change in appetite 1 0 1 0 3  Feeling bad or failure about yourself  0 0 1 0  1  Trouble concentrating 1 0 0 0 1  Moving slowly or fidgety/restless 0 0 0 0 0  Suicidal thoughts 0 0 0 0 1  PHQ-9 Score 6 3  8   0  17   Difficult doing work/chores Not difficult at all Not difficult at all Somewhat difficult Not difficult at all Somewhat difficult     Data saved with a previous flowsheet row definition       05/13/2024    8:19 AM 03/19/2024    3:44 PM 09/22/2021    8:58 AM 04/08/2020    8:40 AM  GAD 7 : Generalized Anxiety Score  Nervous, Anxious, on Edge 0 0 0 0  Control/stop worrying 1 0 0 0  Worry too much - different things 1 1 1  0  Trouble relaxing 0 1 0 0  Restless 0 0 0 0  Easily annoyed or irritable 1 0 1 0  Afraid - awful might happen 0 0 0 0  Total GAD 7 Score 3 2 2  0  Anxiety Difficulty Not difficult at all Somewhat difficult Not difficult at all Not difficult at all       Assessment & Plan:  Assessment & Plan   Type 2 diabetes mellitus with hyperglycemia, without long-term current use of insulin (HCC) Morbid obesity with BMI of 40.0-44.9, adult (HCC) Well-controlled with A1c of 6. Ozempic  effective for glycemic  control but causes GI side effects. Discussed switch to Mounjaro  for better tolerance as well as further support with weight loss. Previously on metformin  and janumet but had GI upset so was discontinued (last take around 2021). Proteinuria present, kidney function normal. Discussed potential future use of Jardiance  if proteinuria persists. Eye exam done. LDL at goal. - Switched from Ozempic  to Mounjaro  pending insurance approval. - Monitor urine protein levels annually. - Consider Jardiance  in the future if proteinuria persists.  -     Tirzepatide ; Inject 2.5 mg into the skin once a week.  Dispense: 2 mL; Refill: 0  Hand pain, right Persistent pain in two fingers. Awaiting final radiologist report. - Await final radiologist report. - Consider strengthening exercises for fingers.    Follow up plan: 3 mo TOC  Hadassah SHAUNNA Nett, MD

## 2024-05-14 ENCOUNTER — Telehealth: Payer: Self-pay

## 2024-05-14 NOTE — Telephone Encounter (Signed)
 Pharmacy Patient Advocate Encounter   Received notification from Onbase that prior authorization for Mounjaro  2.5MG /0.5ML auto-injectors is required/requested.   Insurance verification completed.   The patient is insured through [place] [employer]ICA[employer].   Per test claim: PA required; PA submitted to above mentioned insurance via Latent Key/confirmation #/EOC [id number] Status is pending

## 2024-05-15 ENCOUNTER — Encounter: Payer: Self-pay | Admitting: Pediatrics

## 2024-05-15 ENCOUNTER — Other Ambulatory Visit: Payer: Self-pay | Admitting: Pediatrics

## 2024-05-15 DIAGNOSIS — E1165 Type 2 diabetes mellitus with hyperglycemia: Secondary | ICD-10-CM

## 2024-05-15 MED ORDER — TRULICITY 0.75 MG/0.5ML ~~LOC~~ SOAJ: 0.7500 mg | SUBCUTANEOUS | 0 refills | Status: AC

## 2024-05-15 NOTE — Telephone Encounter (Signed)
See message from PA team

## 2024-05-15 NOTE — Progress Notes (Signed)
 Mounjaro  not approved. Will send trulicity .  Lisa SHAUNNA Nett, MD

## 2024-05-15 NOTE — Telephone Encounter (Signed)
 Called and notified patient of providers message. Patient is ok with taking the Trulicity  and would like it sent in for her.

## 2024-05-15 NOTE — Telephone Encounter (Signed)
 Pharmacy Patient Advocate Encounter  Received notification from [facility] [employer]  that Prior Authorization for Mounjaro  2.5MG /0.5ML auto-injectors  has been DENIED.  Full denial letter will be uploaded to the media tab. See denial reason below.   PA #/Case ID/Reference #: [id number]

## 2024-05-19 ENCOUNTER — Other Ambulatory Visit (HOSPITAL_COMMUNITY): Payer: Self-pay

## 2024-05-19 ENCOUNTER — Telehealth: Payer: Self-pay

## 2024-05-19 NOTE — Telephone Encounter (Signed)
 Pharmacy Patient Advocate Encounter  Received notification from [place] [employer] that Prior Authorization for Trulicity  0.75MG /0.5ML auto-injectors  has been APPROVED from 05/19/24 to 05/19/25   PA #/Case ID/Reference #: [id number]

## 2024-05-19 NOTE — Telephone Encounter (Signed)
 Pharmacy Patient Advocate Encounter   Received notification from Onbase that prior authorization for Trulicity  0.75MG /0.5ML auto-injectors is required/requested.   Insurance verification completed.   The patient is insured through [place] complete.   Per test claim: PA required; PA submitted to above mentioned insurance via Latent Key/confirmation #/EOC [id number] Status is pending

## 2024-05-26 ENCOUNTER — Ambulatory Visit: Payer: Self-pay | Admitting: Pediatrics

## 2024-06-01 ENCOUNTER — Encounter: Payer: Self-pay | Admitting: Oncology

## 2024-06-03 ENCOUNTER — Encounter: Payer: Self-pay | Admitting: Oncology

## 2024-06-08 ENCOUNTER — Encounter: Payer: Self-pay | Admitting: Oncology

## 2024-06-08 ENCOUNTER — Ambulatory Visit: Payer: Self-pay

## 2024-06-08 VITALS — BP 142/85 | HR 82 | Ht 61.0 in | Wt 209.0 lb

## 2024-06-08 DIAGNOSIS — Z3009 Encounter for other general counseling and advice on contraception: Secondary | ICD-10-CM

## 2024-06-08 DIAGNOSIS — Z716 Tobacco abuse counseling: Secondary | ICD-10-CM

## 2024-06-08 DIAGNOSIS — Z113 Encounter for screening for infections with a predominantly sexual mode of transmission: Secondary | ICD-10-CM

## 2024-06-08 LAB — HM HIV SCREENING LAB: HM HIV Screening: NEGATIVE

## 2024-06-08 MED ORDER — ULIPRISTAL ACETATE 30 MG PO TABS: 1.0000 | ORAL_TABLET | Freq: Once | ORAL | 0 refills | Status: AC

## 2024-06-08 MED ORDER — NORETHINDRONE 0.35 MG PO TABS: 1.0000 | ORAL_TABLET | Freq: Every day | ORAL | 12 refills | Status: AC

## 2024-06-08 NOTE — Progress Notes (Signed)
 " [facility] [address] [place] [address] Main phone: [phone]  Family Planning Visit - Initial Visit  Subjective:  Lisa Richard is a 39 y.o.  [id number]   being seen today for an initial annual visit and to discuss reproductive life planning.  The patient is currently using no method - no contraceptive precautions for pregnancy prevention. Patient does not want a pregnancy in the next year.   Patient reports they are looking for a method with the following characteristics:  Cycle control Does not involve insertion  Ready when they are Method they can control starting and stopping  Patient has the following medical conditions: Patient Active Problem List   Diagnosis Date Noted   Hyperlipidemia 05/09/2023   Type 2 diabetes mellitus with hyperglycemia, without long-term current use of insulin (HCC) 12/25/2019   Anxiety 12/18/2019   Insomnia 12/18/2019   Essential hypertension 02/07/2017   Anemia affecting pregnancy in third trimester 10/12/2016   Morbid obesity with BMI of 40.0-44.9, adult (HCC) 08/08/2016   HPI Patient reports desire for contraception. Hx of HTN, DM type 2, currently smokes cigarettes. Patient reports shortness of breath when walking for a few months. Sometimes has palpitations. No chest pain.  Does endorse heavy periods. Has cramping before and after her period. Endorses some clots - size of a quarter or slightly. No dizziness. Has had heavy periods for years. Patient denies other concerns.    Review of Systems  All other systems reviewed and are negative.  Diabetes screening This patient is 39 y.o. with a BMI of Body mass index is 39.49 kg/m.SABRA  Is patient eligible for diabetes screening (age >35 and BMI >25)?  not applicable  Was Hgb A1c ordered? not applicable - has type 2 DM  STI screening Patient reports 1 of partners in last year.  Does this patient desire STI  screening?  Yes - blood tests only, declined GC/CT swab and declined to self swab  Hepatitis C screening Has patient been screened once for HCV in the past?  Yes  No results found for: HCVAB  Does the patient meet criteria for HCV testing? No  (If yes-- Screen for HCV through [facility]) Criteria:  Since the last HCV result, does the patient have any of the following? - Current drug use - Have a partner with drug use - Has been incarcerated  Hepatitis B screening Does the patient meet criteria for HBV testing? No Criteria:  -Household, sexual or needle sharing contact with HBV -History of drug use -HIV positive -Those with known Hep C  Cervical Cancer Screening   Pap done in January 2025 - [facility]. Repeat 2030.  Result Date Procedure Results Follow-ups  01/15/2020 Cytology - PAP High risk HPV: Negative Adequacy: Satisfactory for evaluation; transformation zone component ABSENT. Diagnosis: - Negative for intraepithelial lesion or malignancy (NILM) Microorganisms: Shift in flora suggestive of bacterial vaginosis Comment: Normal Reference Range HPV - Negative   08/09/2016 IGP, CtNg, rfx Aptima HPV ASCU DIAGNOSIS:: Comment Specimen adequacy:: Comment Clinician Provided ICD10: Comment Performed by:: Comment PAP Smear Comment: . Note:: Comment Test Methodology: Comment PAP Reflex: Comment Chlamydia, Nuc. Acid Amp: Negative Gonococcus by Nucleic Acid Amp: Negative    Health Maintenance Due  Topic Date Due   Hepatitis B Vaccines 19-59 Average Risk (1 of 3 - 19+ 3-dose series) Never done   HPV VACCINES (1 - 3-dose SCDM series) Never done   COVID-19 Vaccine (1 - 2025-26  season) Never done   The following portions of the patient's history were reviewed and updated as appropriate: allergies, current medications, past family history, past medical history, past social history, past surgical history and problem list. Problem list updated.  See flowsheet for further  details and programmatic requirements Hyperlink available at the top of the signed note in blue.  Flow sheet content below:  Pregnancy Intention Screening Does the patient want to become pregnant in the next year?: No Does the patient's partner want to become pregnant in the next year?: N/A Would the patient like to discuss contraceptive options today?: Yes Other:  Password: Simba Is it okay to contact you by mail?: Yes Difficulty accessing hygiene products (feminine products/soap) in the last 3 months: No Contraception History Past methods of contraception used by patient:: Hormonal Implant Adverse effects associated with Hormonal Implant: pain in arm Sexual History What age did you start your period?: 13 How often do you have your period?: monthly Date of last sex?: 06/06/24 Has the patient had unprotected sex within the last 5 days?: Yes Do you have sex with men, women, both men and women?: Men only In the past 2 months how many partners have you had sex with?: 1 In the past 12 months, how many partners have you had sex with?: 1 Is it possible that any of your sex partners in the past 12 months had sex with someone else whild they were still in a sexual relationship with you?: No What ways do you have sex?: Vaginal, Oral Do you or your partner use condoms and/or dental dams every time you have vaginal, oral or anal sex?: No Do you douche?: No Date of last HIV test?: 01/14/17 Have you ever had an STD?: Yes Have any of your partners had an STD?: Yes Partner Previous STD?: Trichomonas, Chlamydia Date?:  (2008 or 09) Have you or your partner ever shot up drugs?: No Have any of your partners used drugs in the past?: No Have you or your partners exchanged money or drugs for sex?: No Risk Factors for Hep B Household, sexual, or needle sharing contact of a person infected with Hep B: No Sexual contact with a person who uses drugs not as prescribed?: No Currently or Ever used drugs not  as prescribed: No HIV Positive: No PRep Patient: No Men who have sex with men: No Have Hepatitis C: No History of Incarceration: No History of Homeslessness?: No Anal sex following anal drug use?: No Risk Factors for Hep C Currently using drugs not as prescribed: No Sexual partner(s) currently using drugs as not prescribed: No History of drug use: No HIV Positive: No People with a history of incarceration: No People born between the years of 59 and 60: No Advise Advised client to quit or stay quit. : Yes  Objective:   Vitals:   06/08/24 0828  BP: (!) 142/85  Pulse: 82  Weight: 209 lb (94.8 kg)  Height: 5' 1 (1.549 m)    Physical Exam Constitutional:      Appearance: Normal appearance.  HENT:     Head: Normocephalic.     Mouth/Throat:     Mouth: Mucous membranes are moist.     Pharynx: Oropharynx is clear. No pharyngeal swelling, oropharyngeal exudate or posterior oropharyngeal erythema.  Eyes:     General: No scleral icterus.       Right eye: No discharge.        Left eye: No discharge.  Cardiovascular:     Heart  sounds: Normal heart sounds, S1 normal and S2 normal.  Pulmonary:     Effort: Pulmonary effort is normal. No respiratory distress.     Breath sounds: Decreased air movement present. Decreased breath sounds present. No wheezing, rhonchi or rales.  Genitourinary:    Comments: Declined genital exam Lymphadenopathy:     Cervical: No cervical adenopathy.  Skin:    General: Skin is warm and dry.  Neurological:     Mental Status: She is alert.  Psychiatric:        Mood and Affect: Mood normal.        Behavior: Behavior normal.    Assessment and Plan:  Orlinda Slomski is a 39 y.o. female presenting to the [facility] Department for an initial annual wellness/contraceptive visit  1. Family planning (Primary)  - norethindrone  (MICRONOR ) 0.35 MG tablet; Take 1 tablet (0.35 mg total) by mouth daily.  Dispense: 28 tablet; Refill:  12 - ulipristal acetate  (ELLA ) 30 MG tablet; Take 1 tablet (30 mg total) by mouth once for 1 dose.  Dispense: 1 tablet; Refill: 0  Contraception counseling:  Reviewed options based on patient desire and reproductive life plan. Patient is interested in Oral Contraceptive. This was provided to the patient today.  Risks, benefits, and typical effectiveness rates were reviewed.  Questions were answered.  Written information was also given to the patient to review.    The patient will follow up in  1 years for surveillance.  The patient was told to call with any further questions, or with any concerns about this method of contraception.  Emphasized use of condoms 100% of the time for STI prevention.  Emergency Contraception Precautions (ECP): Patient assessed for need of ECP. She is a candidate based on report of unprotected sex within past 72 hours (3 days).  Educated on ECP and reviewed options.  Patient desires Ella  (Ulipristal).    2. Screening for venereal disease  - HIV [facility] - Syphilis Serology, [facility]  3. Tobacco abuse counseling  - Quit line info given  - Decreased lung sounds, and patient reports intermittent SOB with exertion - Advised PCP follow up for tobacco cessation and SOB - Well appearing today/no distress  Return in about 1 year (around 06/08/2025).  Future Appointments  Date Time Provider Department Center  09/11/2024 10:20 AM Valerio Melanie DASEN, NP [facility] [address]   Damien FORBES Satchel, NP "

## 2024-06-24 ENCOUNTER — Encounter: Payer: Self-pay | Admitting: Oncology

## 2024-07-03 ENCOUNTER — Encounter: Payer: Self-pay | Admitting: Oncology

## 2024-09-11 ENCOUNTER — Encounter: Admitting: Nurse Practitioner
